# Patient Record
Sex: Male | Born: 1960 | Race: White | Hispanic: No | Marital: Married | Smoking: Never smoker
Health system: Southern US, Community
[De-identification: ages and names within clinical notes are randomized; demographics above are authoritative.]

## PROBLEM LIST (undated history)

## (undated) DIAGNOSIS — E785 Hyperlipidemia, unspecified: Secondary | ICD-10-CM

## (undated) DIAGNOSIS — R0683 Snoring: Secondary | ICD-10-CM

## (undated) DIAGNOSIS — I10 Essential (primary) hypertension: Secondary | ICD-10-CM

## (undated) DIAGNOSIS — R5383 Other fatigue: Secondary | ICD-10-CM

## (undated) HISTORY — DX: Other fatigue: R53.83

## (undated) HISTORY — DX: Essential (primary) hypertension: I10

## (undated) HISTORY — PX: SHOULDER SURGERY: SHX246

## (undated) HISTORY — DX: Hyperlipidemia, unspecified: E78.5

## (undated) HISTORY — DX: Snoring: R06.83

---

## 2004-09-02 ENCOUNTER — Ambulatory Visit (HOSPITAL_COMMUNITY): Admission: RE | Admit: 2004-09-02 | Discharge: 2004-09-02 | Payer: Self-pay | Admitting: Family Medicine

## 2005-09-22 ENCOUNTER — Ambulatory Visit (HOSPITAL_COMMUNITY): Admission: RE | Admit: 2005-09-22 | Discharge: 2005-09-22 | Payer: Self-pay | Admitting: Family Medicine

## 2005-10-21 ENCOUNTER — Encounter (HOSPITAL_COMMUNITY): Admission: RE | Admit: 2005-10-21 | Discharge: 2005-11-20 | Payer: Self-pay | Admitting: Orthopaedic Surgery

## 2006-12-24 ENCOUNTER — Ambulatory Visit (HOSPITAL_COMMUNITY): Admission: RE | Admit: 2006-12-24 | Discharge: 2006-12-24 | Payer: Self-pay | Admitting: General Surgery

## 2007-03-25 ENCOUNTER — Ambulatory Visit (HOSPITAL_COMMUNITY): Admission: RE | Admit: 2007-03-25 | Discharge: 2007-03-25 | Payer: Self-pay | Admitting: Family Medicine

## 2009-07-16 ENCOUNTER — Ambulatory Visit (HOSPITAL_COMMUNITY): Admission: RE | Admit: 2009-07-16 | Discharge: 2009-07-16 | Payer: Self-pay | Admitting: General Surgery

## 2010-12-09 NOTE — Op Note (Signed)
NAME:  Robert Curtis, Robert Curtis NO.:  [id number]   MEDICAL RECORD NO.:  [id number]          PATIENT TYPE:  AMB   LOCATION:  DAY                           FACILITY:  [facility]   PHYSICIAN:  Dalia Heading, M.D.  DATE OF BIRTH:  1961/06/09   DATE OF PROCEDURE:  12/24/2006  DATE OF DISCHARGE:                               OPERATIVE REPORT   PREOPERATIVE DIAGNOSIS:  Anal fistula.   POSTOPERATIVE DIAGNOSIS:  Anal fistula, submuscular.   PROCEDURE:  Anal fistulotomy.   ANESTHESIA:  General.   INDICATIONS:  The patient is a 50 year old white male with a recurrent  anal fistula.  The patient now comes to the operating room for an anal  fistulotomy.  Risks and benefits of the procedure including bleeding,  infection, pain, recurrence, and incontinence were fully explained to  the patient, who gave informed consent.   PROCEDURE NOTE:  The patient was placed in the lithotomy position after  general anesthesia was administered.  The perineum was prepped and  draped in the usual sterile technique with Betadine.  Surgical site  confirmation was performed.   The patient was noted to have a draining sinus at the 2 o'clock  position.  A probe was used and this did connect with the dentate line  directly in a straight line, but it was partially submuscular in nature.  A fistulotomy was performed partially, leaving the external sphincter  muscle intact.  The pocket was curetted.  Any bleeding was controlled  using Bovie electrocautery.  0.5% Sensorcaine was instilled in the  surrounding region.  NuGauze was then packed into the wound.   All tape and needle counts were correct at the end of the procedure.  The patient was awakened and transferred to PACU in stable condition.  Complications none.   SPECIMEN:  None.   BLOOD LOSS:  Minimal.      Dalia Heading, M.D.  Electronically Signed     MAJ/MEDQ  D:  12/24/2006  T:  12/24/2006  Job:  [id number]   cc:   Corrie Mckusick,  M.D.  Fax: [phone]

## 2010-12-09 NOTE — H&P (Signed)
NAME:  Robert Curtis, Robert Curtis NO.:  [id number]   MEDICAL RECORD NO.:  [id number]          PATIENT TYPE:  AMB   LOCATION:                                FACILITY:  [facility]   PHYSICIAN:  Dalia Heading, M.D.  DATE OF BIRTH:  1961-07-09   DATE OF ADMISSION:  DATE OF DISCHARGE:  LH                              HISTORY & PHYSICAL   CHIEF COMPLAINT:  Anal fistula.   HISTORY OF PRESENT ILLNESS:  The patient is a 50 year old white male who  had a persistent perianal fistula.  He has failed two courses of  antibiotics now presents for an anal fistulotomy.   PAST MEDICAL HISTORY:  Includes hypertension, reflux disease.   PAST SURGICAL HISTORY:  Unremarkable.   CURRENT MEDICATIONS:  Toprol, loratadine, Darvocet.   ALLERGIES:  No known drug allergies.   REVIEW OF SYSTEMS:  Noncontributory.   PHYSICAL EXAMINATION:  GENERAL:  The patient is a well-developed, well-  nourished, white male in no acute distress.  LUNGS:  Clear to auscultation with equal breath sounds bilaterally.  HEART EXAMINATION:  Reveals regular rate and rhythm without S3, S4,  murmurs.  RECTAL EXAMINATION:  Reveals a small draining area with induration and a  sinus present along the left side of the anus.   IMPRESSION:  Anal fistula.   PLAN:  The patient is scheduled for an anal fistulotomy on 12/24/2006.  The risks and benefits of the procedure including bleeding, infection,  and recurrence of the fistula were fully explained to the patient, gave  informed consent.      Dalia Heading, M.D.  Electronically Signed     MAJ/MEDQ  D:  12/16/2006  T:  12/16/2006  Job:  [id number]   cc:   Corrie Mckusick, M.D.  Fax: [phone]

## 2011-08-27 ENCOUNTER — Ambulatory Visit (HOSPITAL_COMMUNITY)
Admission: RE | Admit: 2011-08-27 | Discharge: 2011-08-27 | Disposition: A | Discharge status: Home / Self Care | Payer: 59 | Source: Ambulatory Visit | Attending: Internal Medicine | Admitting: Internal Medicine

## 2011-08-27 ENCOUNTER — Other Ambulatory Visit (HOSPITAL_COMMUNITY): Payer: Self-pay | Admitting: Internal Medicine

## 2011-08-27 DIAGNOSIS — R05 Cough: Secondary | ICD-10-CM

## 2011-08-27 DIAGNOSIS — J069 Acute upper respiratory infection, unspecified: Secondary | ICD-10-CM

## 2011-08-27 DIAGNOSIS — R059 Cough, unspecified: Secondary | ICD-10-CM | POA: Insufficient documentation

## 2011-08-27 DIAGNOSIS — R0602 Shortness of breath: Secondary | ICD-10-CM | POA: Insufficient documentation

## 2012-11-25 ENCOUNTER — Ambulatory Visit (HOSPITAL_COMMUNITY)
Admission: RE | Admit: 2012-11-25 | Discharge: 2012-11-25 | Disposition: A | Discharge status: Home / Self Care | Payer: 59 | Source: Ambulatory Visit | Attending: Family Medicine | Admitting: Family Medicine

## 2012-11-25 ENCOUNTER — Other Ambulatory Visit (HOSPITAL_COMMUNITY): Payer: Self-pay | Admitting: Family Medicine

## 2012-11-25 DIAGNOSIS — E785 Hyperlipidemia, unspecified: Secondary | ICD-10-CM

## 2012-11-25 DIAGNOSIS — R05 Cough: Secondary | ICD-10-CM

## 2012-11-25 DIAGNOSIS — R059 Cough, unspecified: Secondary | ICD-10-CM

## 2012-11-25 DIAGNOSIS — I10 Essential (primary) hypertension: Secondary | ICD-10-CM

## 2013-01-19 ENCOUNTER — Ambulatory Visit (INDEPENDENT_AMBULATORY_CARE_PROVIDER_SITE_OTHER): Payer: 59 | Admitting: Otolaryngology

## 2013-01-19 DIAGNOSIS — R490 Dysphonia: Secondary | ICD-10-CM

## 2013-01-19 DIAGNOSIS — R05 Cough: Secondary | ICD-10-CM

## 2013-01-19 DIAGNOSIS — J310 Chronic rhinitis: Secondary | ICD-10-CM

## 2013-01-19 DIAGNOSIS — K219 Gastro-esophageal reflux disease without esophagitis: Secondary | ICD-10-CM

## 2013-02-23 ENCOUNTER — Ambulatory Visit (INDEPENDENT_AMBULATORY_CARE_PROVIDER_SITE_OTHER): Payer: 59 | Admitting: Otolaryngology

## 2013-02-23 DIAGNOSIS — K219 Gastro-esophageal reflux disease without esophagitis: Secondary | ICD-10-CM

## 2013-02-23 DIAGNOSIS — R070 Pain in throat: Secondary | ICD-10-CM

## 2014-02-09 ENCOUNTER — Ambulatory Visit (INDEPENDENT_AMBULATORY_CARE_PROVIDER_SITE_OTHER): Payer: BC Managed Care – PPO | Admitting: Urology

## 2014-02-09 DIAGNOSIS — E669 Obesity, unspecified: Secondary | ICD-10-CM

## 2014-02-09 DIAGNOSIS — E291 Testicular hypofunction: Secondary | ICD-10-CM

## 2015-08-15 ENCOUNTER — Encounter: Payer: Self-pay | Admitting: Neurology

## 2015-08-15 ENCOUNTER — Ambulatory Visit (INDEPENDENT_AMBULATORY_CARE_PROVIDER_SITE_OTHER): Payer: BLUE CROSS/BLUE SHIELD | Admitting: Neurology

## 2015-08-15 VITALS — BP 142/102 | HR 72 | Resp 20 | Ht 66.0 in | Wt 294.0 lb

## 2015-08-15 DIAGNOSIS — R519 Headache, unspecified: Secondary | ICD-10-CM

## 2015-08-15 DIAGNOSIS — R0683 Snoring: Secondary | ICD-10-CM

## 2015-08-15 DIAGNOSIS — R51 Headache: Secondary | ICD-10-CM | POA: Diagnosis not present

## 2015-08-15 NOTE — Patient Instructions (Signed)

## 2015-08-15 NOTE — Progress Notes (Signed)
[facility]   Provider:  Melvyn Novas, M D  Referring Provider: Karleen Hampshire, MD Primary Care Physician:  Colette Ribas, MD  Chief Complaint  Patient presents with  . New Patient (Initial Visit)    snoring per pt wife, rm 10, alone    HPI:  Robert Curtis is a 55 y.o. male , seen here as a referral  from Dr. Phillips Odor,  Chief complaint according to patient : " my wife stated that I snore, and I feel tired "  Robert Curtis is seen here today upon referral of his primary care physician. He reported that his wife was concerned about his snoring and that he himself has felt more fatigued and daytime sleepy than he used to be. Over the last couple of years he has become more increasingly fatigued and sleepy in daytime. This gentleman go up as a some of the [place] man and reported that he had been through 13 schools in 12 years of grade school. He now lives for over 3 decades in [place] on his paternal family farm. He works for a Human resources officer. His wife is using a CPAP machine and has been familiar with the treatment of obstructive sleep apnea. She was the one who reported to him that she snores.  Sleep habits are as follows: The patient describes the marital bedroom is cool, quiet and dark. He sleeps usually on one pillow, his bedtime is around 10 PM and he falls asleep promptly. He stays asleep for at least 2-3 hours before his sleep is for the first time interrupted usually for bathroom break. He may then discontinue to sleep quickly until he has a second bathroom break around 4:30.AM . He wakes up at 5:30 usually before his alarm goes off. Sometimes he wakes up with headaches and often with a dry mouth. He takes an hour or longer to feel woken up so he is neither restored normal refreshed in the morning.   He drinks coffee in the morning but he does not have a regular breakfast. He drinks coffee on the way to the office. 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  Him days the patient struggles to stay awake but is not usually the case. He also stated that his idea of a nap is lasting for hours. And that is very rare that he has the opportunity to do so. He works regularly indoors but has no access to natural daylight.   Sleep medical history and family sleep history:  His father is a loud snorer but does not have observed apneas. Father and mother struggled with alcohol. Father had cluster headaches, Paternal cousin on CPAP, obese 450 pounds. No history of sleep walking.   Social history: one cup of coffee, no caffeine use in the afternoon. At home he uses decaffeinated iced tea and decaffeinated sodas. He has never been a tobacco user, he drinks infrequently alcohol. Probably less than 7 drinks a month.  Review of Systems: Out of a complete 14 system review, the patient complains of only the following symptoms, and all other reviewed systems are negative. Snoring.   Epworth score 4 , Fatigue severity score 29  , depression score 2   Social History   Social History  . Marital Status: Married    Spouse Name: N/A  . Number of Children: N/A  . Years of Education: N/A   Occupational History  . Not on  file.   Social History Main Topics  . Smoking status: Never Smoker   . Smokeless tobacco: Not on file  . Alcohol Use: 0.0 oz/week    0 Standard drinks or equivalent per week     Comment: 1-3 times per month  . Drug Use: No  . Sexual Activity: Not on file   Other Topics Concern  . Not on file   Social History Narrative   1-2 cups of coffee daily.    Family History  Problem Relation Age of Onset  . Hypertension Father   . Prostate cancer Father   . Hyperlipidemia Brother   . Hypertension Brother   . Lung cancer Paternal Grandmother   . Prostate cancer Paternal Grandfather   . Hypertension Mother   . Atrial fibrillation Mother     Past Medical History  Diagnosis Date  . Fatigue     . Hyperlipidemia   . Hypertension   . Snoring     Past Surgical History  Procedure Laterality Date  . Shoulder surgery      Current Outpatient Prescriptions  Medication Sig Dispense Refill  . aspirin 81 MG tablet Take 81 mg by mouth daily.    . benzonatate (TESSALON) 100 MG capsule Take by mouth 3 (three) times daily as needed for cough.    . cetirizine (ZYRTEC) 10 MG tablet Take 10 mg by mouth daily.    . furosemide (LASIX) 40 MG tablet Take 40 mg by mouth daily as needed.    . hydrochlorothiazide (HYDRODIURIL) 25 MG tablet Take 25 mg by mouth daily.    Marland Kitchen ipratropium (ATROVENT) 0.03 % nasal spray Place 2 sprays into both nostrils every 12 (twelve) hours.    . Magnesium 250 MG TABS Take 250 mg by mouth.    Marland Kitchen MEGARED OMEGA-3 KRILL OIL PO Take 600 mg by mouth.    . metoprolol succinate (TOPROL-XL) 100 MG 24 hr tablet Take 100 mg by mouth daily. Take with or immediately following a meal.    . omeprazole (PRILOSEC) 40 MG capsule Take 40 mg by mouth daily.    . Potassium Chloride ER 20 MEQ TBCR Take 20 mEq by mouth.    . ranitidine (ZANTAC) 150 MG tablet Take 150 mg by mouth 2 (two) times daily.    . Vitamin D, Ergocalciferol, (DRISDOL) 50000 units CAPS capsule Take 50,000 Units by mouth every 7 (seven) days.     No current facility-administered medications for this visit.    Allergies as of 08/15/2015 - Review Complete 08/15/2015  Allergen Reaction Noted  . Tetracyclines & related Nausea Only 08/15/2015    Vitals: BP 142/102 mmHg  Pulse 72  Resp 20  Ht  (1.676 m)  Wt 294 lb (133.358 kg)  BMI 47.48 kg/m2 Last Weight:  Wt Readings from Last 1 Encounters:  08/15/15 294 lb (133.358 kg)   ZOX:WRUE mass index is 47.48 kg/(m^2).     Last Height:   Ht Readings from Last 1 Encounters:  08/15/15  (1.676 m)    Physical exam:  General: The patient is awake, alert and appears not in acute distress. The patient is well groomed. Head: Normocephalic, atraumatic. Neck is  supple. Mallampati 1, red   neck circumference: 18 inches , Nasal airflow , TMJ is  evident . Retrognathia is seen.  Cardiovascular:  Regular rate and rhythm, without  murmurs or carotid bruit, and without distended neck veins. Respiratory: Lungs are clear to auscultation. Skin:  Without evidence of edema, or rash  Trunk: BMI is elevated - 48 . The patient's posture is erect   Neurologic exam : The patient is awake and alert, oriented to place and time.   Memory subjective  described as intact.  Attention span & concentration ability appears normal.  Speech is fluent,  without  dysarthria, dysphonia or aphasia.  Mood and affect are appropriate.  Cranial nerves: Pupils are equal and briskly reactive to light. Funduscopic exam deferred. Extraocular movements in vertical and horizontal planes intact and without nystagmus. Visual fields by finger perimetry are intact. Hearing to finger rub intact. Facial sensation intact to fine touch. Facial motor strength is symmetric and tongue and uvula move midline. Shoulder shrug is symmetrical.   Motor exam: Normal tone, muscle bulk and symmetric strength in all extremities.  Sensory:  Fine touch, pinprick and vibration were tested in all extremities. Proprioception tested in the upper extremities was normal.  Coordination: Rapid alternating movements and finger-to-nose maneuver without evidence of ataxia, dysmetria or tremor.  Gait and station: Patient walks without assistive device and is able unassisted to climb up to the exam table. Strength within normal limits.  Stance is stable and normal.  Deep tendon reflexes: in the  upper and lower extremities are symmetric and intact. Babinski maneuver response is  downgoing.  The patient was advised of the nature of the diagnosed sleep disorder , the treatment options and risks for general a health and wellness arising from not treating the condition.  I spent more than 40 minutes of face to face time with  the patient. Greater than 50% of time was spent in counseling and coordination of care. We have discussed the diagnosis and differential and I answered the patient's questions.     Assessment:  After physical and neurologic examination, review of laboratory studies,  Personal review of imaging studies, reports of other /same  Imaging studies ,  Results of polysomnography/ neurophysiology testing and pre-existing records as far as provided in visit., my assessment is   1) Mr. Hoffer has multiple risk factors for obstructive sleep apnea he has a larger neck circumference and most at 18 inches, his elevated body mass index at approximately 48, and he is barrel chested. However his upper airway is not exceedingly narrow. He has been witnessed to snore but his wife has not reported having witnessed apneas. He himself has not woken himself up gasping for air or feeling air hungry. He wakes up feeling unrefreshed, unrestored having a dry mouth and at least occasionally morning headaches. He has never experienced cluster headaches at night waking him. The baseline assessment Will include an attended sleep study with SPIT protocol and Co2 monitoring.   Plan:  Treatment plan and additional workup :  split, Co2, O2 monitoring.   RV after sleep study.    Porfirio Mylar Eniya Cannady MD  08/15/2015   CC: Karleen Hampshire, Md [address] [place], [place] [address]

## 2015-09-10 ENCOUNTER — Telehealth: Payer: Self-pay | Admitting: Neurology

## 2015-09-10 DIAGNOSIS — R0683 Snoring: Secondary | ICD-10-CM

## 2015-09-10 NOTE — Telephone Encounter (Signed)
[employer] denied split in lab sleep study. [employer] states it doesn't meet medical criteria.  I would be glad to see if I can get a Home sleep test approved.

## 2015-09-10 NOTE — Telephone Encounter (Signed)
HST is it. CD

## 2015-09-24 ENCOUNTER — Telehealth: Payer: Self-pay | Admitting: Neurology

## 2015-09-24 DIAGNOSIS — G4730 Sleep apnea, unspecified: Secondary | ICD-10-CM

## 2015-09-24 DIAGNOSIS — R0683 Snoring: Secondary | ICD-10-CM

## 2015-09-24 NOTE — Telephone Encounter (Signed)
Patient is snoring loudly , operating a vehicle and sleepy in day time.

## 2015-09-24 NOTE — Telephone Encounter (Signed)
[employer] denied the Home Sleep Test.  [employer] had also denied the Split Sleep Test.  Snoring doesn't meet criteria.

## 2015-10-29 ENCOUNTER — Encounter (INDEPENDENT_AMBULATORY_CARE_PROVIDER_SITE_OTHER): Payer: BLUE CROSS/BLUE SHIELD | Admitting: Neurology

## 2015-10-29 DIAGNOSIS — R0683 Snoring: Secondary | ICD-10-CM

## 2015-10-29 DIAGNOSIS — G4733 Obstructive sleep apnea (adult) (pediatric): Secondary | ICD-10-CM

## 2015-11-07 ENCOUNTER — Telehealth: Payer: Self-pay

## 2015-11-07 DIAGNOSIS — G4733 Obstructive sleep apnea (adult) (pediatric): Secondary | ICD-10-CM

## 2015-11-07 NOTE — Telephone Encounter (Signed)
Spoke to pt and advised him that his HST revealed severe osa and hypoxia and treatment is advised. Dr. Vickey Hugerohmeier advises that pap therapy is indicated and a pap titration study is needed to optimize therapy. I advised weight loss and sleeping on pt's side. I advised pt to avoid sedative hypnotics which may worsen sleep apnea, alcohol, and tobacco. I advised pt to lose weight, diet, and exercise if not contraindicated by his other physicians. I advised pt to not drive or operate hazardous machinery while sleepy. Pt verbalized understanding. Pt is agreeable to coming in for a cpap titration. Pt is concerned that his insurance will not cover it. I advised pt that Dr. Oliva Bustardohmeier's next option is usually an auto-pap trial. Pt verbalized understanding.

## 2015-11-07 NOTE — Telephone Encounter (Signed)
Called pt to discuss sleep study results. No answer, left a message asking him to call me back. 

## 2015-11-13 ENCOUNTER — Telehealth: Payer: Self-pay | Admitting: Neurology

## 2015-11-13 DIAGNOSIS — G4733 Obstructive sleep apnea (adult) (pediatric): Secondary | ICD-10-CM

## 2015-11-13 NOTE — Telephone Encounter (Signed)
CPAP titration denied. HST revealed severe OSA/hypoxia. AHI was 50.6/hr with lowest oxygen saturation of 64%.  Ok to order auto cpap 5-15 cm H2O?

## 2015-11-13 NOTE — Telephone Encounter (Signed)
CPAP denied doesn't meet criteria, APAP was approved ref# [id number] from 11/12/15-02/10/16 with the DME company of [employer].

## 2015-11-13 NOTE — Addendum Note (Signed)
Addended by: Geronimo RunningINKINS, Jameshia Hayashida A on: 11/13/2015 02:04 PM   Modules accepted: Orders

## 2015-11-13 NOTE — Telephone Encounter (Signed)
Spoke to pt. I advised him that his cpap titration study was denied. Dr. Vickey Hugerohmeier recommends an auto cpap. Pt is agreeable to trying an auto cpap in his home at night. I advised pt that an order for cpap will be sent to a DME, and the DME will call him to set up the cpap. Pt verbalized understanding. Will send to [facility]. Pt was advised to not drive or operate hazardous machinery when sleepy. Pt verbalized understanding.  A follow up appt was made with pt for 6/21 at 3:30. Pt verbalized understanding.

## 2015-11-13 NOTE — Telephone Encounter (Signed)
Please order auto titration.

## 2016-01-15 ENCOUNTER — Ambulatory Visit (INDEPENDENT_AMBULATORY_CARE_PROVIDER_SITE_OTHER): Payer: BLUE CROSS/BLUE SHIELD | Admitting: Neurology

## 2016-01-15 ENCOUNTER — Encounter: Payer: Self-pay | Admitting: Neurology

## 2016-01-15 VITALS — BP 156/80 | HR 88 | Resp 20 | Ht 67.0 in | Wt 289.0 lb

## 2016-01-15 DIAGNOSIS — G4734 Idiopathic sleep related nonobstructive alveolar hypoventilation: Secondary | ICD-10-CM | POA: Diagnosis not present

## 2016-01-15 DIAGNOSIS — G4733 Obstructive sleep apnea (adult) (pediatric): Secondary | ICD-10-CM

## 2016-01-15 DIAGNOSIS — Z9989 Dependence on other enabling machines and devices: Secondary | ICD-10-CM

## 2016-01-15 NOTE — Progress Notes (Signed)
SLEEP MEDICINE CLINIC   Provider:  Melvyn Novas, M D  Referring Provider: Assunta Found, MD Primary Care Physician:  Colette Ribas, MD  Chief Complaint  Patient presents with  . Follow-up    cpap    HPI:  Robert Curtis is a 55 y.o. male , seen here as a referral  from Dr. Phillips Odor,  Chief complaint according to patient : " my wife stated that I snore, and I feel tired "  Robert Curtis is seen here today upon referral of his primary care physician. He reported that his wife was concerned about his snoring and that he himself has felt more fatigued and daytime sleepy than he used to be. Over the last couple of years he has become more increasingly fatigued and sleepy in daytime. This gentleman go up as a some of the [place] man and reported that he had been through 13 schools in 12 years of grade school. He now lives for over 3 decades in [place] on his paternal family farm. He works for a Human resources officer. His wife is using a CPAP machine and has been familiar with the treatment of obstructive sleep apnea. She was the one who reported to him that she snores.  Sleep habits are as follows: The patient describes the marital bedroom is cool, quiet and dark. He sleeps usually on one pillow, his bedtime is around 10 PM and he falls asleep promptly. He stays asleep for at least 2-3 hours before his sleep is for the first time interrupted usually for bathroom break. He may then discontinue to sleep quickly until he has a second bathroom break around 4:30.AM . He wakes up at 5:30 usually before his alarm goes off. Sometimes he wakes up with headaches and often with a dry mouth. He takes an hour or longer to feel woken up so he is neither restored normal refreshed in the morning.   He drinks coffee in the morning but he does not have a regular breakfast. He drinks coffee on the way to the office. 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    Him days the patient struggles to stay awake but is not usually the case. He also stated that his idea of a nap is lasting for hours. And that is very rare that he has the opportunity to do so. He works regularly indoors but has no access to natural daylight.    Interval history from 01/15/2016. Robert Curtis is here today in a revisit following his home sleep test dated 4 of April 2017. The home sleep test indicated a significant degree of obstructive apnea with an AHI of 50.6 and an RDI of 53.3. The patient did not have bradycardia or tachyarrhythmia but is very prolonged time of low oxygen. The lowest oxygen levels at night was 64% saturation with a total desaturation time of  236 minutes. DME : He was referred to [facility].  In the meantime he has been placed on CPAP auto settings between 5 and 15 cm water with 3 cm EPR. I'm able to review a 30 day download today he has a 90% compliance is used to machine every day but 27 days over 4 hours consecutively. The average user time is 5 hours and 47 minutes. The residual AHI is now 2.1 he does have high air leaks however. He likes a FFM , has a mustache, but no other facial hair.Marland Kitchen  Sleep medical history and family sleep history:  His father is a loud snorer but does not have observed apneas. Father and mother struggled with alcohol. Father had cluster headaches, Paternal cousin on CPAP, obese 450 pounds. No history of sleep walking.  Social history: one cup of coffee, no caffeine use in the afternoon. At home he uses decaffeinated iced tea and decaffeinated sodas. He has never been a tobacco user, he drinks infrequently alcohol. Probably less than 7 drinks a month.  Review of Systems: Out of a complete 14 system review, the patient complains of only the following symptoms, and all other reviewed systems are negative. Snoring now controlled on CPAP.  Obesity, large abdominal width.   Epworth score 5, Fatigue severity score 29  , depression  score 0/15   Social History   Social History  . Marital Status: Married    Spouse Name: N/A  . Number of Children: N/A  . Years of Education: N/A   Occupational History  . Not on file.   Social History Main Topics  . Smoking status: Never Smoker   . Smokeless tobacco: Not on file  . Alcohol Use: 0.0 oz/week    0 Standard drinks or equivalent per week     Comment: 1-3 times per month  . Drug Use: No  . Sexual Activity: Not on file   Other Topics Concern  . Not on file   Social History Narrative   1-2 cups of coffee daily.    Family History  Problem Relation Age of Onset  . Hypertension Father   . Prostate cancer Father   . Hyperlipidemia Brother   . Hypertension Brother   . Lung cancer Paternal Grandmother   . Prostate cancer Paternal Grandfather   . Hypertension Mother   . Atrial fibrillation Mother     Past Medical History  Diagnosis Date  . Fatigue   . Hyperlipidemia   . Hypertension   . Snoring     Past Surgical History  Procedure Laterality Date  . Shoulder surgery      Current Outpatient Prescriptions  Medication Sig Dispense Refill  . aspirin 81 MG tablet Take 81 mg by mouth daily.    . benzonatate (TESSALON) 100 MG capsule Take by mouth 3 (three) times daily as needed for cough.    . cetirizine (ZYRTEC) 10 MG tablet Take 10 mg by mouth daily.    . furosemide (LASIX) 40 MG tablet Take 40 mg by mouth daily as needed.    . hydrochlorothiazide (HYDRODIURIL) 25 MG tablet Take 25 mg by mouth daily.    Marland Kitchen. ipratropium (ATROVENT) 0.03 % nasal spray Place 2 sprays into both nostrils every 12 (twelve) hours.    . Magnesium 250 MG TABS Take 250 mg by mouth.    Marland Kitchen. MEGARED OMEGA-3 KRILL OIL PO Take 600 mg by mouth.    . metoprolol succinate (TOPROL-XL) 100 MG 24 hr tablet Take 100 mg by mouth daily. Take with or immediately following a meal.    . omeprazole (PRILOSEC) 40 MG capsule Take 40 mg by mouth daily.    . Potassium Chloride ER 20 MEQ TBCR Take 20 mEq  by mouth.    . ranitidine (ZANTAC) 150 MG tablet Take 150 mg by mouth 2 (two) times daily.    . Vitamin D, Ergocalciferol, (DRISDOL) 50000 units CAPS capsule Take 50,000 Units by mouth every 7 (seven) days.     No current facility-administered medications for this visit.    Allergies as of  01/15/2016 - Review Complete 01/15/2016  Allergen Reaction Noted  . Tetracyclines & related Nausea Only 08/15/2015    Vitals: BP 156/80 mmHg  Pulse 88  Resp 20  Ht 5\' 7"  (1.702 m)  Wt 289 lb (131.09 kg)  BMI 45.25 kg/m2 Last Weight:  Wt Readings from Last 1 Encounters:  01/15/16 289 lb (131.09 kg)   VHQ:IONG mass index is 45.25 kg/(m^2).     Last Height:   Ht Readings from Last 1 Encounters:  01/15/16 5\' 7"  (1.702 m)    Physical exam:  General: The patient is awake, alert and appears not in acute distress. The patient is well groomed. Head: Normocephalic, atraumatic. Neck is supple. Mallampati 1, red   neck circumference: 18 inches , Nasal airflow , TMJ is  evident . Retrognathia is seen.  Cardiovascular:  Regular rate and rhythm, without  murmurs or carotid bruit, and without distended neck veins. Respiratory: Lungs are clear to auscultation. Skin:  Without evidence of edema, or rash Trunk: BMI is elevated - 48 . The patient's posture is erect   Neurologic exam : The patient is awake and alert, oriented to place and time.   Memory subjective  described as intact.  Attention span & concentration ability appears normal.  Speech is fluent,  without  dysarthria, dysphonia or aphasia.  Mood and affect are appropriate.  Cranial nerves: Pupils are equal and briskly reactive to light. Funduscopic exam deferred. Extraocular movements in vertical and horizontal planes intact and without nystagmus. Visual fields by finger perimetry are intact. Hearing to finger rub intact. Facial sensation intact to fine touch. Facial motor strength is symmetric and tongue and uvula move midline. Shoulder shrug  is symmetrical.    The patient was advised of the nature of the diagnosed sleep disorder , the treatment options and risks for general a health and wellness arising from not treating the condition.  I spent more than 20 minutes of face to face time with the patient. Greater than 50% of time was spent in counseling and coordination of care. We have discussed the diagnosis and differential and I answered the patient's questions.     Assessment:  After physical and neurologic examination, review of laboratory studies,  Personal review of imaging studies, reports of other /same  Imaging studies ,  Results of polysomnography/ neurophysiology testing and pre-existing records as far as provided in visit., my assessment is   1) Mr. Herter has multiple risk factors for obstructive sleep apnea ; a larger neck circumference at 18 inches, his elevated body mass index at approximately 48, and he is barrel chested. However his upper airway is not exceedingly narrow. His insurance only allowed a home sleep test and not an attended sleep test as I had originally ordered,  however the home sleep test identified a severe apnea hypotony index at 50.6 and an RDI 53.3. Based on this and a BMI of 48 the patient was placed on CPAP direct outer titration and his AHI has been reduced to 2.1 with a 90 fifths pressure percentile of 11.5 cm water. Needs no adjustments. The patient is 90% compliant and he can follow-up once a year from now on.  Should there be an increase or decrease in his body mass index or any injury or surgical alteration of his upper airway I would like to see him earlier.   Porfirio Mylar Amilyah Nack MD  01/15/2016   CC: Assunta Found, Md [address] [place], [place] [address]

## 2016-04-08 ENCOUNTER — Ambulatory Visit (HOSPITAL_COMMUNITY)
Admission: RE | Admit: 2016-04-08 | Discharge: 2016-04-08 | Disposition: A | Discharge status: Home / Self Care | Payer: BLUE CROSS/BLUE SHIELD | Source: Ambulatory Visit | Attending: Family Medicine | Admitting: Family Medicine

## 2016-04-08 ENCOUNTER — Other Ambulatory Visit (HOSPITAL_COMMUNITY): Payer: Self-pay | Admitting: Family Medicine

## 2016-04-08 DIAGNOSIS — Z1389 Encounter for screening for other disorder: Secondary | ICD-10-CM | POA: Diagnosis present

## 2016-04-08 DIAGNOSIS — R2241 Localized swelling, mass and lump, right lower limb: Secondary | ICD-10-CM | POA: Insufficient documentation

## 2016-04-08 DIAGNOSIS — Z6841 Body Mass Index (BMI) 40.0 and over, adult: Secondary | ICD-10-CM | POA: Diagnosis not present

## 2016-04-09 ENCOUNTER — Other Ambulatory Visit (HOSPITAL_COMMUNITY): Payer: Self-pay | Admitting: Family Medicine

## 2016-04-10 ENCOUNTER — Other Ambulatory Visit (HOSPITAL_COMMUNITY): Payer: Self-pay | Admitting: Family Medicine

## 2016-04-10 DIAGNOSIS — R2241 Localized swelling, mass and lump, right lower limb: Secondary | ICD-10-CM

## 2016-06-09 ENCOUNTER — Ambulatory Visit (HOSPITAL_COMMUNITY): Admission: RE | Admit: 2016-06-09 | Payer: BLUE CROSS/BLUE SHIELD | Source: Ambulatory Visit

## 2016-06-10 ENCOUNTER — Other Ambulatory Visit (HOSPITAL_COMMUNITY): Payer: Self-pay | Admitting: Family Medicine

## 2016-06-10 ENCOUNTER — Ambulatory Visit (HOSPITAL_COMMUNITY)
Admission: RE | Admit: 2016-06-10 | Discharge: 2016-06-10 | Disposition: A | Discharge status: Home / Self Care | Payer: BLUE CROSS/BLUE SHIELD | Source: Ambulatory Visit | Attending: Family Medicine | Admitting: Family Medicine

## 2016-06-10 ENCOUNTER — Ambulatory Visit (HOSPITAL_COMMUNITY): Payer: BLUE CROSS/BLUE SHIELD

## 2016-06-10 DIAGNOSIS — R2241 Localized swelling, mass and lump, right lower limb: Secondary | ICD-10-CM

## 2016-06-10 LAB — CREATININE, SERUM: CREATININE: 1.12 mg/dL (ref 0.61–1.24)

## 2016-06-10 MED ORDER — GADOBENATE DIMEGLUMINE 529 MG/ML IV SOLN
20.0000 mL | Freq: Once | INTRAVENOUS | Status: AC | PRN
  Administered 2016-06-10: 20 mL via INTRAVENOUS

## 2016-11-11 ENCOUNTER — Encounter: Payer: Self-pay | Admitting: Neurology

## 2017-01-14 ENCOUNTER — Ambulatory Visit: Payer: BLUE CROSS/BLUE SHIELD | Admitting: Neurology

## 2017-11-23 ENCOUNTER — Ambulatory Visit (HOSPITAL_COMMUNITY)
Admission: RE | Admit: 2017-11-23 | Discharge: 2017-11-23 | Disposition: A | Discharge status: Home / Self Care | Payer: Managed Care, Other (non HMO) | Source: Ambulatory Visit | Attending: Family Medicine | Admitting: Family Medicine

## 2017-11-23 ENCOUNTER — Other Ambulatory Visit (HOSPITAL_COMMUNITY): Payer: Self-pay | Admitting: Family Medicine

## 2017-11-23 DIAGNOSIS — R0602 Shortness of breath: Secondary | ICD-10-CM

## 2017-11-23 DIAGNOSIS — J069 Acute upper respiratory infection, unspecified: Secondary | ICD-10-CM | POA: Diagnosis not present

## 2017-11-23 DIAGNOSIS — Z6841 Body Mass Index (BMI) 40.0 and over, adult: Secondary | ICD-10-CM | POA: Diagnosis present

## 2017-12-28 ENCOUNTER — Ambulatory Visit: Payer: Managed Care, Other (non HMO) | Admitting: Cardiology

## 2019-07-17 ENCOUNTER — Telehealth: Payer: Self-pay | Admitting: *Deleted

## 2019-07-17 NOTE — Telephone Encounter (Signed)
Patients wife was in the office today and states patients CPAP mask is torn and he needs a new one.  He is scheduled for a follow up appt in February but he needs this taken care of ASAP.  Please call to discuss what his options are

## 2019-07-17 NOTE — Telephone Encounter (Signed)
Called the patient and discussed with him that it has been over 3 yrs since we last saw him in office visit. The patient was originally set up with his machine through [facility] but has since then purchased supplies online. He attempted to order through a supplier and they required a script to be sent. Advised the patient that since we have not seen him in 3 yrs we would need a referral from PCP and we can get him in for a apt. Advised the patient that they had scheduled him for a follow up 2/4 but informed that since it has been over 3 yrs it would need to come across as a new referral before we can actually schedule the patient. Patient was confused because all he needs is a mask, I  Informed him that we have to follow up with him yearly for insurance purposes and that allows [place] to order supplies and document compliance. Pt will call PCP. Patient also states with COVID he is not wanting to come in office. Informed we have mychart video visit capability and we can offer that as a option. The pt download is able to print from [facility]. I will cancel the feb apt.

## 2019-08-31 ENCOUNTER — Ambulatory Visit: Payer: BLUE CROSS/BLUE SHIELD | Admitting: Neurology

## 2021-07-23 ENCOUNTER — Ambulatory Visit (HOSPITAL_COMMUNITY)
Admission: RE | Admit: 2021-07-23 | Discharge: 2021-07-23 | Disposition: A | Discharge status: Home / Self Care | Payer: Managed Care, Other (non HMO) | Source: Ambulatory Visit | Attending: Family Medicine | Admitting: Family Medicine

## 2021-07-23 ENCOUNTER — Other Ambulatory Visit: Payer: Self-pay

## 2021-07-23 ENCOUNTER — Other Ambulatory Visit (HOSPITAL_COMMUNITY): Payer: Self-pay | Admitting: Family Medicine

## 2021-07-23 DIAGNOSIS — R053 Chronic cough: Secondary | ICD-10-CM

## 2023-05-14 LAB — LAB REPORT - SCANNED
EGFR (Non-African Amer.): 67
PSA, Total: 0.7
TSH: 3.16 (ref 0.41–5.90)

## 2023-06-06 IMAGING — DX DG CHEST 2V
2 series · 2 of 2 positions shown · non-contrast
Comparison: 11/23/2017

CLINICAL DATA: Cough for 2 months.

EXAM:
CHEST - 2 VIEW

[chest pa]
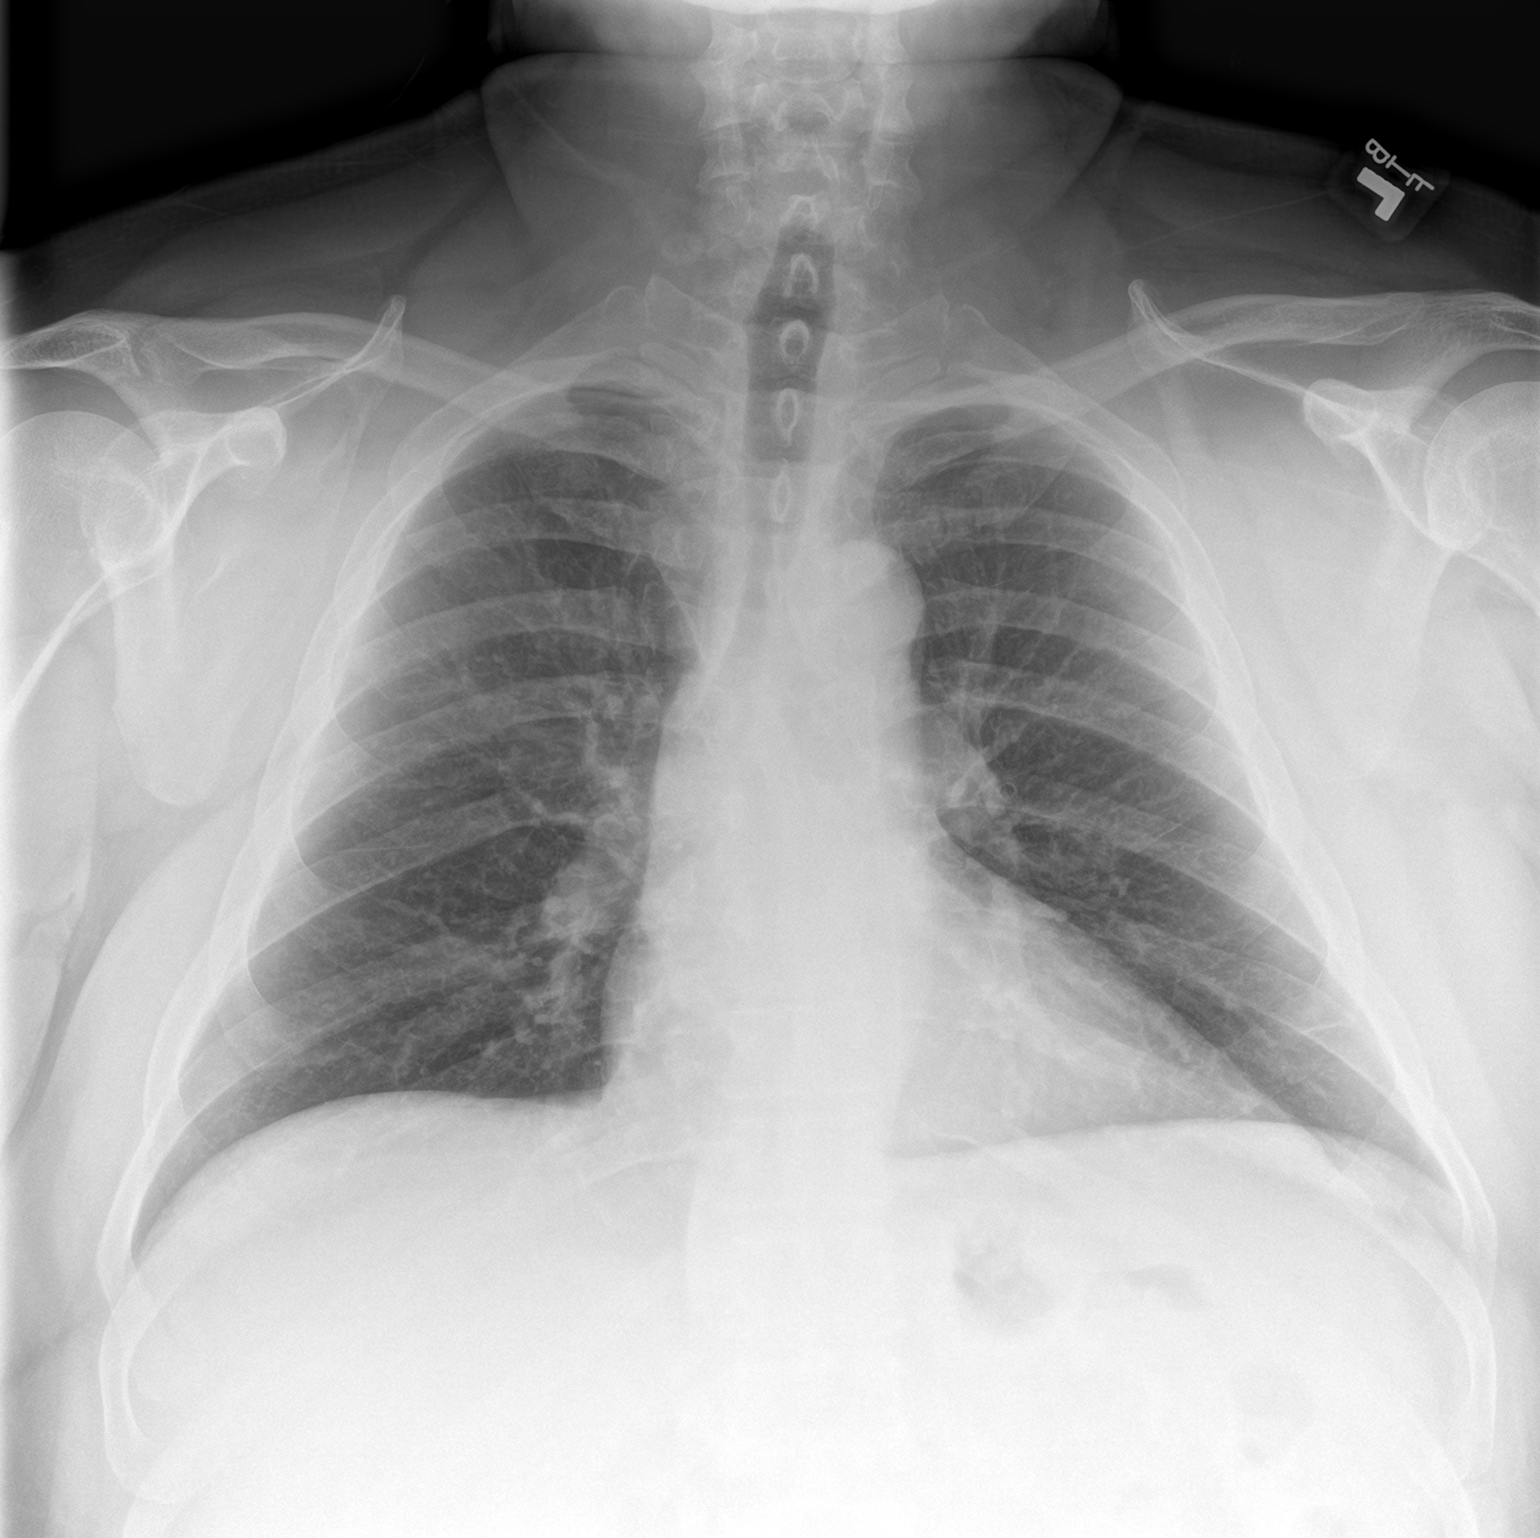

[chest lat]
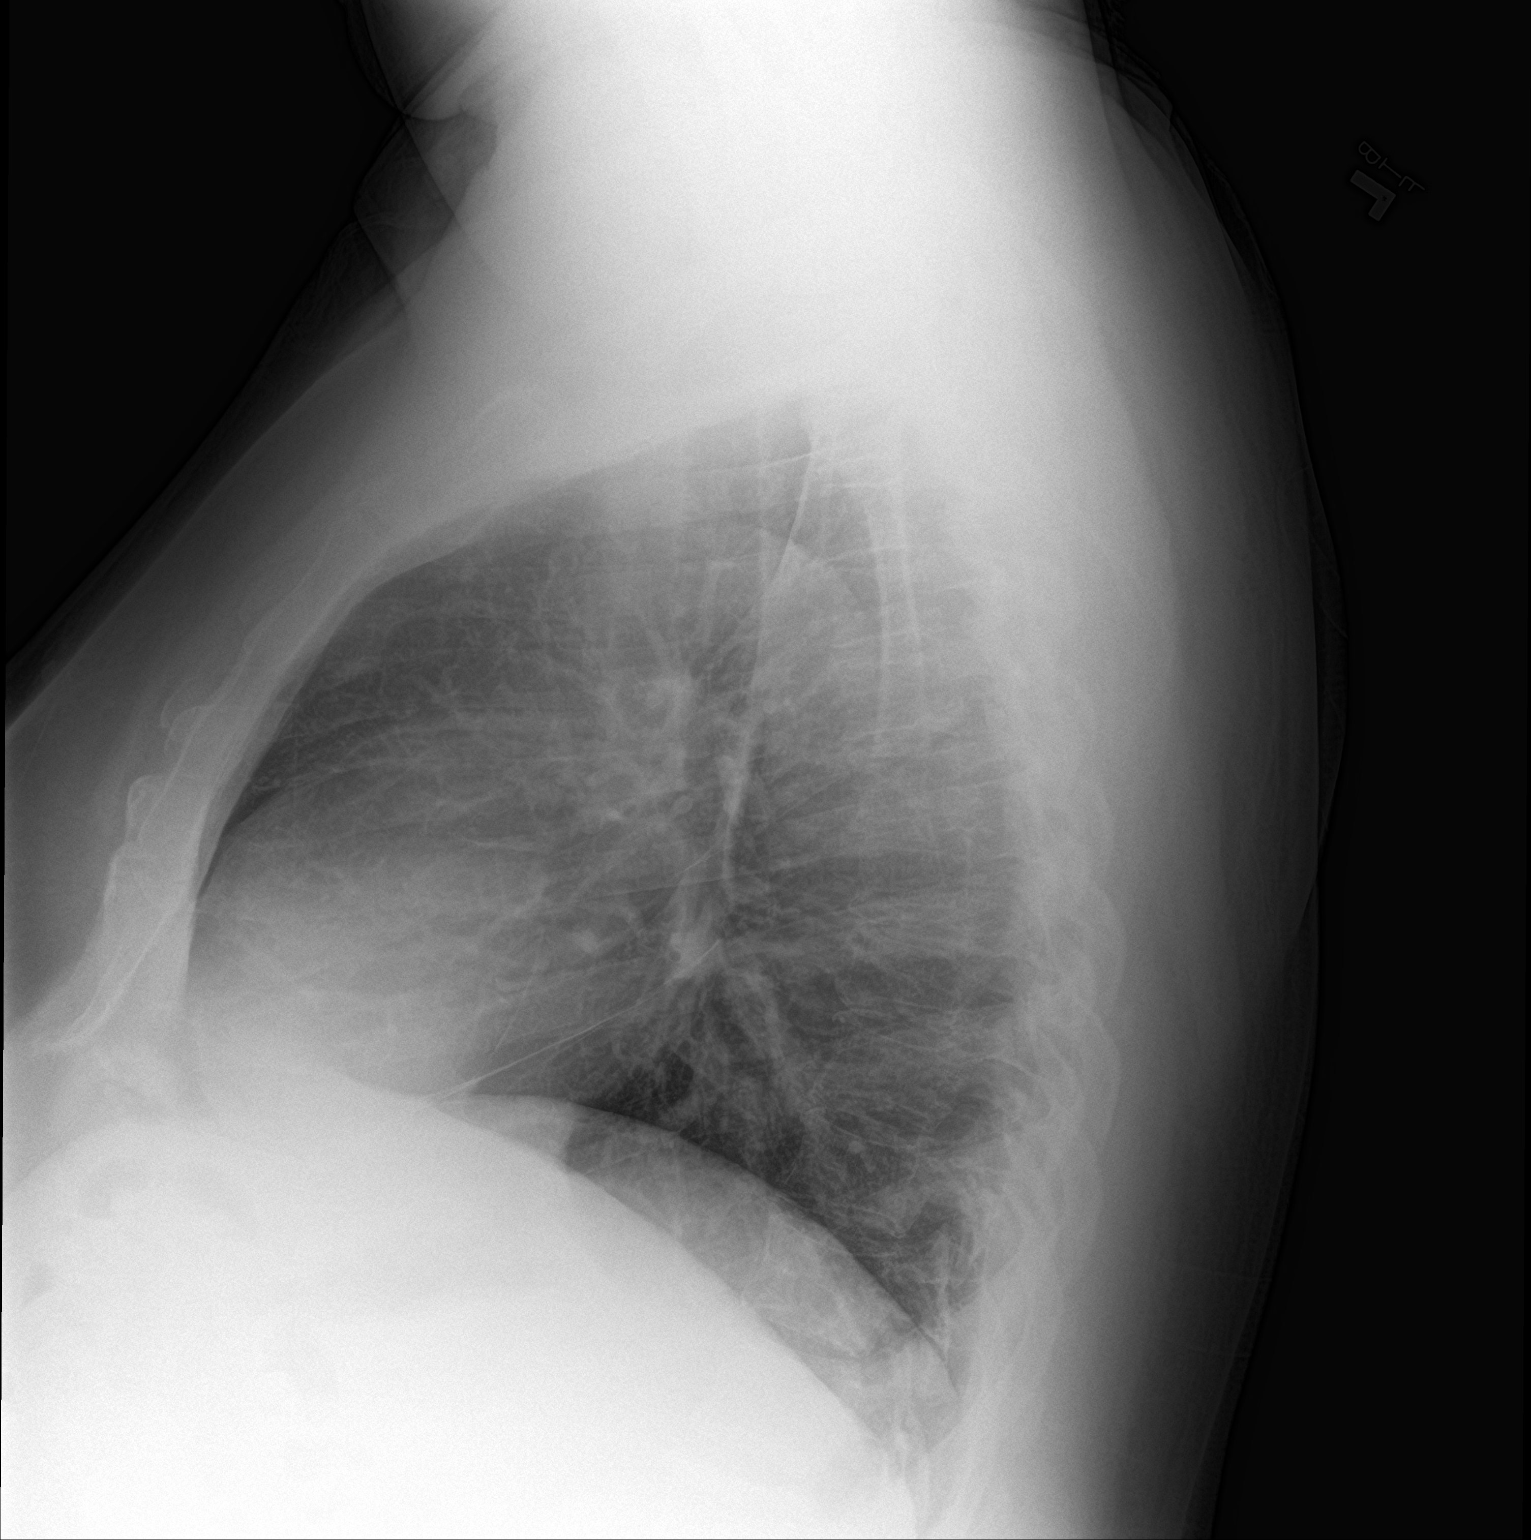

[2 of 2 positions shown; findings below may reference images not displayed]

FINDINGS: The heart size and mediastinal contours are within normal limits.
Both lungs are clear. The visualized skeletal structures are
unremarkable.
IMPRESSION: No active cardiopulmonary disease.

## 2023-11-10 NOTE — H&P (View-Only) (Signed)
 Cardiology Office Note:  .   Date:  11/11/2023  ID:  Robert Curtis, DOB 1960/09/01, MRN [id number] PCP: Minus Amel, MD  [facility] Providers Cardiologist:  Oneil Bigness, MD    History of Present Illness: .    Chief Complaint  Patient presents with   Chest Pain    Robert Curtis is a 63 y.o. male with history of obesity, HTN, HLD who presents for the evaluation of chest pain at the request of Minus Amel, MD.   History of Present Illness   Robert Curtis is a 63 year old male with hypertension, hyperlipidemia, and obesity who presents with chest pain. He was referred by Dr. Glady Laming for evaluation of chest pain.  He has been experiencing chest pain for over a week, initially triggered by walking up a steep incline. The pain is described as a tightness and dull aching sensation, akin to being in a 'vice grip'. It typically lasts a couple of minutes and resolves with rest. A similar episode occurred at work, which did not resolve quickly, prompting him to seek medical attention. He has continued to have daily chest discomfort with exertion.   He has a history of premature ventricular contractions (PVCs) diagnosed 30 years ago, managed by avoiding caffeine and Sudafed. He has not had issues with PVCs for a long time until the recent episodes of chest pain.  The chest discomfort occurs with exertion, such as walking or physical activity, and resolves with rest. He did not experience chest pain while sitting at home but did experience discomfort upon arriving at the clinic after walking around the building.  He takes medication for hypertension but does not take cholesterol medication. He has been taking a full dose aspirin instead of a baby aspirin. He also takes Celebrex for fibromyalgia and arthritis.  His family history is significant for heart disease, with his father's mother and his mother having heart issues. His mother had atrial fibrillation. He does not  smoke and rarely consumes alcohol.  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          Problem List Obesity 2. HLD -T chol 223, HDL 39, LDL 152, TG 173 3. HTN 4. Fibromyalgia     ROS: All other ROS reviewed and negative. Pertinent positives noted in the HPI.     Studies Reviewed: Aaron Aas   EKG Interpretation Date/Time:  Thursday November 11 2023 14:21:44 EDT Ventricular Rate:  77 PR Interval:  152 QRS Duration:  88 QT Interval:  370 QTC Calculation: 418 R Axis:   -23  Text Interpretation: Normal sinus rhythm Minimal voltage criteria for LVH, may be normal variant ( R in aVL ) Confirmed by Jackquelyn Mass [phone]) on 11/11/2023 2:32:01 PM   Physical Exam:   VS:  BP 134/86   Pulse 77   Ht 5\' 7"  (1.702 m)   Wt 291 lb 9.6 oz (132.3 kg)   SpO2 91%   BMI 45.67 kg/m    Wt Readings from Last 3 Encounters:  11/11/23 291 lb 9.6 oz (132.3 kg)  01/15/16 289 lb (131.1 kg)  08/15/15 294 lb (133.4 kg)    GEN: Well nourished, well developed in no acute distress NECK: No JVD; No carotid bruits CARDIAC: RRR, no murmurs, rubs, gallops RESPIRATORY:  Clear to auscultation without rales, wheezing or rhonchi  ABDOMEN: Soft, non-tender, non-distended EXTREMITIES:  No edema; No deformity  ASSESSMENT AND PLAN: .   Assessment  and Plan    Unstable angina Chest discomfort with minimal activity, resolving with rest, worsening symptoms. EKG shows nonspecific ST depression. Potential coronary artery disease. Risk factors: hyperlipidemia, hypertension, family history. - Schedule heart catheterization at [facility] next week. - Perform pre-procedure blood work today, including BMP and CBC. - Start aspirin 81 mg daily. - Prescribe metoprolol tartrate 25 mg BID. - Prescribe sublingual nitroglycerin as needed for chest pain. Emergency precautions given.  - Schedule echocardiogram before follow-up in six weeks. - Defer starting cholesterol medication until after  catheterization results per patient request.   Hypertension Well-controlled with medication.  Hyperlipidemia Recent LDL of 152 mg/dL. Decision to start cholesterol medication deferred until after heart catheterization results.  Obesity BMI of 45, a risk factor for cardiovascular disease.  Follow-up Necessary to evaluate heart catheterization results and adjust treatment plan. - Schedule follow-up appointment in six weeks after heart catheterization.          Informed Consent   Shared Decision Making/Informed Consent The risks [stroke (1 in 1000), death (1 in 1000), kidney failure [usually temporary] (1 in 500), bleeding (1 in 200), allergic reaction [possibly serious] (1 in 200)], benefits (diagnostic support and management of coronary artery disease) and alternatives of a cardiac catheterization were discussed in detail with Robert Curtis and he is willing to proceed.      Follow-up: Return in about 6 weeks (around 12/23/2023).   Signed, Gigi Kyle. Rolm Clos, MD, [facility]  [facility]  [address], [address] [place], [place] [address] [phone]  5:34 PM

## 2023-11-10 NOTE — Progress Notes (Unsigned)
 Cardiology Office Note:  .   Date:  11/11/2023  ID:  Robert Curtis, DOB 1960/09/01, MRN [id number] PCP: Minus Amel, MD  [facility] Providers Cardiologist:  Oneil Bigness, MD    History of Present Illness: .    Chief Complaint  Patient presents with   Chest Pain    Robert Curtis is a 63 y.o. male with history of obesity, HTN, HLD who presents for the evaluation of chest pain at the request of Minus Amel, MD.   History of Present Illness   Robert Curtis is a 63 year old male with hypertension, hyperlipidemia, and obesity who presents with chest pain. He was referred by Dr. Glady Laming for evaluation of chest pain.  He has been experiencing chest pain for over a week, initially triggered by walking up a steep incline. The pain is described as a tightness and dull aching sensation, akin to being in a 'vice grip'. It typically lasts a couple of minutes and resolves with rest. A similar episode occurred at work, which did not resolve quickly, prompting him to seek medical attention. He has continued to have daily chest discomfort with exertion.   He has a history of premature ventricular contractions (PVCs) diagnosed 30 years ago, managed by avoiding caffeine and Sudafed. He has not had issues with PVCs for a long time until the recent episodes of chest pain.  The chest discomfort occurs with exertion, such as walking or physical activity, and resolves with rest. He did not experience chest pain while sitting at home but did experience discomfort upon arriving at the clinic after walking around the building.  He takes medication for hypertension but does not take cholesterol medication. He has been taking a full dose aspirin instead of a baby aspirin. He also takes Celebrex for fibromyalgia and arthritis.  His family history is significant for heart disease, with his father's mother and his mother having heart issues. His mother had atrial fibrillation. He does not  smoke and rarely consumes alcohol.  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          Problem List Obesity 2. HLD -T chol 223, HDL 39, LDL 152, TG 173 3. HTN 4. Fibromyalgia     ROS: All other ROS reviewed and negative. Pertinent positives noted in the HPI.     Studies Reviewed: Aaron Aas   EKG Interpretation Date/Time:  Thursday November 11 2023 14:21:44 EDT Ventricular Rate:  77 PR Interval:  152 QRS Duration:  88 QT Interval:  370 QTC Calculation: 418 R Axis:   -23  Text Interpretation: Normal sinus rhythm Minimal voltage criteria for LVH, may be normal variant ( R in aVL ) Confirmed by Jackquelyn Mass [phone]) on 11/11/2023 2:32:01 PM   Physical Exam:   VS:  BP 134/86   Pulse 77   Ht 5\' 7"  (1.702 m)   Wt 291 lb 9.6 oz (132.3 kg)   SpO2 91%   BMI 45.67 kg/m    Wt Readings from Last 3 Encounters:  11/11/23 291 lb 9.6 oz (132.3 kg)  01/15/16 289 lb (131.1 kg)  08/15/15 294 lb (133.4 kg)    GEN: Well nourished, well developed in no acute distress NECK: No JVD; No carotid bruits CARDIAC: RRR, no murmurs, rubs, gallops RESPIRATORY:  Clear to auscultation without rales, wheezing or rhonchi  ABDOMEN: Soft, non-tender, non-distended EXTREMITIES:  No edema; No deformity  ASSESSMENT AND PLAN: .   Assessment  and Plan    Unstable angina Chest discomfort with minimal activity, resolving with rest, worsening symptoms. EKG shows nonspecific ST depression. Potential coronary artery disease. Risk factors: hyperlipidemia, hypertension, family history. - Schedule heart catheterization at [facility] next week. - Perform pre-procedure blood work today, including BMP and CBC. - Start aspirin 81 mg daily. - Prescribe metoprolol tartrate 25 mg BID. - Prescribe sublingual nitroglycerin as needed for chest pain. Emergency precautions given.  - Schedule echocardiogram before follow-up in six weeks. - Defer starting cholesterol medication until after  catheterization results per patient request.   Hypertension Well-controlled with medication.  Hyperlipidemia Recent LDL of 152 mg/dL. Decision to start cholesterol medication deferred until after heart catheterization results.  Obesity BMI of 45, a risk factor for cardiovascular disease.  Follow-up Necessary to evaluate heart catheterization results and adjust treatment plan. - Schedule follow-up appointment in six weeks after heart catheterization.          Informed Consent   Shared Decision Making/Informed Consent The risks [stroke (1 in 1000), death (1 in 1000), kidney failure [usually temporary] (1 in 500), bleeding (1 in 200), allergic reaction [possibly serious] (1 in 200)], benefits (diagnostic support and management of coronary artery disease) and alternatives of a cardiac catheterization were discussed in detail with Mr. Rufener and he is willing to proceed.      Follow-up: Return in about 6 weeks (around 12/23/2023).   Signed, Gigi Kyle. Rolm Clos, MD, [facility]  [facility]  [address], [address] [place], [place] [address] [phone]  5:34 PM

## 2023-11-11 ENCOUNTER — Encounter: Payer: Self-pay | Admitting: Cardiovascular Disease

## 2023-11-11 ENCOUNTER — Ambulatory Visit: Attending: Cardiovascular Disease | Admitting: Cardiovascular Disease

## 2023-11-11 ENCOUNTER — Other Ambulatory Visit: Payer: Self-pay | Admitting: *Deleted

## 2023-11-11 VITALS — BP 134/86 | HR 77 | Ht 67.0 in | Wt 291.6 lb

## 2023-11-11 DIAGNOSIS — E782 Mixed hyperlipidemia: Secondary | ICD-10-CM

## 2023-11-11 DIAGNOSIS — I200 Unstable angina: Secondary | ICD-10-CM | POA: Diagnosis not present

## 2023-11-11 DIAGNOSIS — R072 Precordial pain: Secondary | ICD-10-CM | POA: Diagnosis not present

## 2023-11-11 DIAGNOSIS — I150 Renovascular hypertension: Secondary | ICD-10-CM

## 2023-11-11 MED ORDER — METOPROLOL TARTRATE 25 MG PO TABS
25.0000 mg | ORAL_TABLET | Freq: Two times a day (BID) | ORAL | 3 refills | Status: DC
Start: 2023-11-11 — End: 2024-03-10

## 2023-11-11 MED ORDER — ASPIRIN 81 MG PO TBEC: 81.0000 mg | DELAYED_RELEASE_TABLET | Freq: Every day | ORAL | Status: AC

## 2023-11-11 MED ORDER — NITROGLYCERIN 0.4 MG SL SUBL
0.4000 mg | SUBLINGUAL_TABLET | SUBLINGUAL | 6 refills | Status: AC | PRN
Start: 2023-11-11 — End: 2024-03-10

## 2023-11-11 NOTE — Patient Instructions (Signed)
 Medication Instructions:   REDUCE ASPIRIN TO 81 MG ONCE DAILY  START METOPROLOL TARTRATE 25 MG ONE TABLET TWICE DAILY  TAKE NTG ONE TABLET UNDER THE TONGUE FOR CHEST PAIN MAY TAKE UP TO 3- 5 MINUTES APART  *If you need a refill on your cardiac medications before your next appointment, please call your pharmacy*  Testing/Procedures:  Your physician has requested that you have an echocardiogram. Echocardiography is a painless test that uses sound waves to create images of your heart. It provides your doctor with information about the size and shape of your heart and how well your heart's chambers and valves are working. This procedure takes approximately one hour. There are no restrictions for this procedure. Please do NOT wear cologne, perfume, aftershave, or lotions (deodorant is allowed). Please arrive 15 minutes prior to your appointment time.  Please note: We ask at that you not bring children with you during ultrasound (echo/ vascular) testing. Due to room size and safety concerns, children are not allowed in the ultrasound rooms during exams. Our front office staff cannot provide observation of children in our lobby area while testing is being conducted. An adult accompanying a patient to their appointment will only be allowed in the ultrasound room at the discretion of the ultrasound technician under special circumstances. We apologize for any inconvenience. [facility]       Cardiac Catheterization   You are scheduled for a Cardiac Catheterization on Tuesday, April 29 with Dr. Alverda Skeans.  1. Please arrive at the [facility] (Main Entrance A) at [facility]: [address] [place], [place] [address] at 7:00 AM (This time is 2 hour(s) before your procedure to ensure your preparation).   Free valet parking service is available. You will check in at ADMITTING. The support person will be asked to wait in the waiting room.  It is OK to have someone drop you off and  come back when you are ready to be discharged.        Special note: Every effort is made to have your procedure done on time. Please understand that emergencies sometimes delay scheduled procedures.  2. Diet: Do not eat solid foods after midnight.  You may have clear liquids until 5 AM the day of the procedure.  3. Labs: You will need to have blood drawn on TODAY.  4. Medication instructions in preparation for your procedure:  On the morning of your procedure, take Aspirin 81 mg and all morning medications. You may use sips of water.  5. Plan to go home the same day, you will only stay overnight if medically necessary. 6. You MUST have a responsible adult to drive you home. 7. An adult MUST be with you the first 24 hours after you arrive home. 8. Bring a current list of your medications, and the last time and date medication taken. 9. Bring ID and current insurance cards. 10.Please wear clothes that are easy to get on and off and wear slip-on shoes.  Thank you for allowing [place] to care for you!   -- [facility]   Follow-Up: At [facility], you and your health needs are our priority.  As part of our continuing mission to provide you with exceptional heart care, our providers are all part of one team.  This team includes your primary Cardiologist (physician) and Advanced Practice Providers or APPs (Physician Assistants and Nurse Practitioners) who all work together to provide you with the care you need,  when you need it.  Your next appointment:   6 week(s)  Provider:   DR Rolm Clos         1st Floor: - Lobby - Registration  - Pharmacy  - Lab - Cafe  2nd Floor: - PV Lab - Diagnostic Testing (echo, CT, nuclear med)  3rd Floor: - Vacant  4th Floor: - TCTS (cardiothoracic surgery) - AFib Clinic - Structural Heart Clinic - Vascular Surgery  - Vascular Ultrasound  5th Floor: - HeartCare Cardiology (general and EP) - Clinical  Pharmacy for coumadin, hypertension, lipid, weight-loss medications, and med management appointments    Valet parking services will be available as well.

## 2023-11-12 ENCOUNTER — Encounter: Payer: Self-pay | Admitting: Cardiovascular Disease

## 2023-11-12 LAB — CBC
Hematocrit: 43 % (ref 37.5–51.0)
Hemoglobin: 14.9 g/dL (ref 13.0–17.7)
MCH: 28.2 pg (ref 26.6–33.0)
MCHC: 34.7 g/dL (ref 31.5–35.7)
MCV: 81 fL (ref 79–97)
Platelets: 287 10*3/uL (ref 150–450)
RBC: 5.29 x10E6/uL (ref 4.14–5.80)
RDW: 12.7 % (ref 11.6–15.4)
WBC: 6.6 10*3/uL (ref 3.4–10.8)

## 2023-11-12 LAB — BASIC METABOLIC PANEL WITH GFR
BUN/Creatinine Ratio: 15 (ref 10–24)
BUN: 17 mg/dL (ref 8–27)
CO2: 29 mmol/L (ref 20–29)
Calcium: 10.1 mg/dL (ref 8.6–10.2)
Chloride: 101 mmol/L (ref 96–106)
Creatinine, Ser: 1.17 mg/dL (ref 0.76–1.27)
Glucose: 124 mg/dL — ABNORMAL HIGH (ref 70–99)
Potassium: 3.9 mmol/L (ref 3.5–5.2)
Sodium: 144 mmol/L (ref 134–144)
eGFR: 71 mL/min/1.73

## 2023-11-22 ENCOUNTER — Telehealth: Payer: Self-pay | Admitting: *Deleted

## 2023-11-22 NOTE — Telephone Encounter (Signed)
 Cardiac Catheterization scheduled at [facility] for: Tuesday November 23, 2023 9 AM Arrival time [facility] Main Entrance A at: 7 AM  Nothing to eat after midnight prior to procedure, clear liquids until 5 AM day of procedure.  Medication instructions: -Hold:  Olmesartan-hydrochlorothiazide-AM of procedure  -Other usual morning medications can be taken with sips of water including aspirin  81 mg.  Plan to go home the same day, you will only stay overnight if medically necessary.  You must have responsible adult to drive you home.  Someone must be with you the first 24 hours after you arrive home.  Reviewed procedure instructions with patient.

## 2023-11-23 ENCOUNTER — Encounter (HOSPITAL_COMMUNITY)
Admission: RE | Disposition: A | Discharge status: Home / Self Care | Payer: Self-pay | Source: Home / Self Care | Attending: Internal Medicine

## 2023-11-23 ENCOUNTER — Ambulatory Visit (HOSPITAL_COMMUNITY)
Admission: RE | Admit: 2023-11-23 | Discharge: 2023-11-23 | Disposition: A | Discharge status: Home / Self Care | Attending: Internal Medicine | Admitting: Internal Medicine

## 2023-11-23 ENCOUNTER — Other Ambulatory Visit: Payer: Self-pay

## 2023-11-23 ENCOUNTER — Other Ambulatory Visit (HOSPITAL_COMMUNITY): Payer: Self-pay

## 2023-11-23 DIAGNOSIS — Z6841 Body Mass Index (BMI) 40.0 and over, adult: Secondary | ICD-10-CM | POA: Diagnosis not present

## 2023-11-23 DIAGNOSIS — M797 Fibromyalgia: Secondary | ICD-10-CM | POA: Diagnosis not present

## 2023-11-23 DIAGNOSIS — E785 Hyperlipidemia, unspecified: Secondary | ICD-10-CM | POA: Insufficient documentation

## 2023-11-23 DIAGNOSIS — E66813 Obesity, class 3: Secondary | ICD-10-CM | POA: Diagnosis not present

## 2023-11-23 DIAGNOSIS — Z955 Presence of coronary angioplasty implant and graft: Secondary | ICD-10-CM

## 2023-11-23 DIAGNOSIS — Z79899 Other long term (current) drug therapy: Secondary | ICD-10-CM | POA: Diagnosis not present

## 2023-11-23 DIAGNOSIS — I10 Essential (primary) hypertension: Secondary | ICD-10-CM | POA: Insufficient documentation

## 2023-11-23 DIAGNOSIS — Z8249 Family history of ischemic heart disease and other diseases of the circulatory system: Secondary | ICD-10-CM | POA: Diagnosis not present

## 2023-11-23 DIAGNOSIS — I25110 Atherosclerotic heart disease of native coronary artery with unstable angina pectoris: Secondary | ICD-10-CM | POA: Diagnosis not present

## 2023-11-23 DIAGNOSIS — I25119 Atherosclerotic heart disease of native coronary artery with unspecified angina pectoris: Secondary | ICD-10-CM | POA: Diagnosis not present

## 2023-11-23 DIAGNOSIS — Z7982 Long term (current) use of aspirin: Secondary | ICD-10-CM | POA: Diagnosis not present

## 2023-11-23 DIAGNOSIS — I200 Unstable angina: Secondary | ICD-10-CM

## 2023-11-23 DIAGNOSIS — R079 Chest pain, unspecified: Secondary | ICD-10-CM

## 2023-11-23 DIAGNOSIS — I2510 Atherosclerotic heart disease of native coronary artery without angina pectoris: Secondary | ICD-10-CM | POA: Insufficient documentation

## 2023-11-23 DIAGNOSIS — I209 Angina pectoris, unspecified: Secondary | ICD-10-CM | POA: Diagnosis present

## 2023-11-23 HISTORY — PX: CORONARY IMAGING/OCT: CATH118326

## 2023-11-23 HISTORY — PX: LEFT HEART CATH AND CORONARY ANGIOGRAPHY: CATH118249

## 2023-11-23 HISTORY — PX: CORONARY STENT INTERVENTION: CATH118234

## 2023-11-23 LAB — POCT ACTIVATED CLOTTING TIME
Activated Clotting Time: 245 s
Activated Clotting Time: 291 s
Activated Clotting Time: 181 s

## 2023-11-23 SURGERY — LEFT HEART CATH AND CORONARY ANGIOGRAPHY
Anesthesia: LOCAL

## 2023-11-23 MED ORDER — HEPARIN (PORCINE) IN NACL 1000-0.9 UT/500ML-% IV SOLN
INTRAVENOUS | Status: DC | PRN
  Administered 2023-11-23: 1000 mL

## 2023-11-23 MED ORDER — SODIUM CHLORIDE 0.9 % WEIGHT BASED INFUSION: 3.0000 mL/kg/h | INTRAVENOUS | Status: AC

## 2023-11-23 MED ORDER — SODIUM CHLORIDE 0.9% FLUSH: 3.0000 mL | INTRAVENOUS | Status: DC | PRN

## 2023-11-23 MED ORDER — ASPIRIN 81 MG PO TBEC: 81.0000 mg | DELAYED_RELEASE_TABLET | Freq: Every day | ORAL | 0 refills | Status: DC

## 2023-11-23 MED ORDER — ROSUVASTATIN CALCIUM 40 MG PO TABS
40.0000 mg | ORAL_TABLET | Freq: Every day | ORAL | 11 refills | Status: DC
  Filled 2023-11-23: qty 30, 30d supply, fill #0

## 2023-11-23 MED ORDER — CLOPIDOGREL BISULFATE 300 MG PO TABS
ORAL_TABLET | ORAL | Status: AC
  Filled 2023-11-23: qty 2

## 2023-11-23 MED ORDER — CLOPIDOGREL BISULFATE 75 MG PO TABS
75.0000 mg | ORAL_TABLET | Freq: Every day | ORAL | 3 refills | Status: DC
  Filled 2023-11-23: qty 30, 30d supply, fill #0

## 2023-11-23 MED ORDER — SODIUM CHLORIDE 0.9 % WEIGHT BASED INFUSION: 1.0000 mL/kg/h | INTRAVENOUS | Status: DC

## 2023-11-23 MED ORDER — HEPARIN SODIUM (PORCINE) 1000 UNIT/ML IJ SOLN
INTRAMUSCULAR | Status: AC
  Filled 2023-11-23: qty 10

## 2023-11-23 MED ORDER — FENTANYL CITRATE (PF) 100 MCG/2ML IJ SOLN
INTRAMUSCULAR | Status: DC | PRN
  Administered 2023-11-23 (×2): 25 ug via INTRAVENOUS

## 2023-11-23 MED ORDER — LIDOCAINE HCL (PF) 1 % IJ SOLN
INTRAMUSCULAR | Status: DC | PRN
  Administered 2023-11-23: 2 mL

## 2023-11-23 MED ORDER — SODIUM CHLORIDE 0.9 % IV SOLN: 250.0000 mL | INTRAVENOUS | Status: DC | PRN

## 2023-11-23 MED ORDER — ACETAMINOPHEN 325 MG PO TABS: 650.0000 mg | ORAL_TABLET | ORAL | Status: DC | PRN

## 2023-11-23 MED ORDER — FENTANYL CITRATE (PF) 100 MCG/2ML IJ SOLN
INTRAMUSCULAR | Status: AC
  Filled 2023-11-23: qty 2

## 2023-11-23 MED ORDER — CLOPIDOGREL BISULFATE 300 MG PO TABS
ORAL_TABLET | ORAL | Status: DC | PRN
  Administered 2023-11-23: 600 mg via ORAL

## 2023-11-23 MED ORDER — ASPIRIN 81 MG PO CHEW: 81.0000 mg | CHEWABLE_TABLET | Freq: Every day | ORAL | Status: DC

## 2023-11-23 MED ORDER — LIDOCAINE HCL (PF) 1 % IJ SOLN
INTRAMUSCULAR | Status: AC
  Filled 2023-11-23: qty 30

## 2023-11-23 MED ORDER — MIDAZOLAM HCL 2 MG/2ML IJ SOLN
INTRAMUSCULAR | Status: AC
  Filled 2023-11-23: qty 2

## 2023-11-23 MED ORDER — CLOPIDOGREL BISULFATE 75 MG PO TABS: 75.0000 mg | ORAL_TABLET | Freq: Every day | ORAL | Status: DC

## 2023-11-23 MED ORDER — VERAPAMIL HCL 2.5 MG/ML IV SOLN
INTRAVENOUS | Status: AC
  Filled 2023-11-23: qty 2

## 2023-11-23 MED ORDER — SODIUM CHLORIDE 0.9 % IV SOLN: INTRAVENOUS | Status: AC

## 2023-11-23 MED ORDER — PANTOPRAZOLE SODIUM 40 MG PO TBEC
40.0000 mg | DELAYED_RELEASE_TABLET | Freq: Every day | ORAL | 3 refills | Status: DC
  Filled 2023-11-23: qty 30, 30d supply, fill #0

## 2023-11-23 MED ORDER — ASPIRIN 81 MG PO CHEW: 81.0000 mg | CHEWABLE_TABLET | ORAL | Status: DC

## 2023-11-23 MED ORDER — VERAPAMIL HCL 2.5 MG/ML IV SOLN
INTRAVENOUS | Status: DC | PRN
  Administered 2023-11-23: 5 mL via INTRA_ARTERIAL

## 2023-11-23 MED ORDER — SODIUM CHLORIDE 0.9 % WEIGHT BASED INFUSION: 3.0000 mL/kg/h | INTRAVENOUS | Status: DC

## 2023-11-23 MED ORDER — ONDANSETRON HCL 4 MG/2ML IJ SOLN: 4.0000 mg | Freq: Four times a day (QID) | INTRAMUSCULAR | Status: DC | PRN

## 2023-11-23 MED ORDER — MIDAZOLAM HCL 2 MG/2ML IJ SOLN
INTRAMUSCULAR | Status: DC | PRN
  Administered 2023-11-23 (×2): 1 mg via INTRAVENOUS

## 2023-11-23 MED ORDER — LABETALOL HCL 5 MG/ML IV SOLN: 10.0000 mg | INTRAVENOUS | Status: DC | PRN

## 2023-11-23 MED ORDER — HYDRALAZINE HCL 20 MG/ML IJ SOLN: 10.0000 mg | INTRAMUSCULAR | Status: DC | PRN

## 2023-11-23 MED ORDER — IOHEXOL 350 MG/ML SOLN
INTRAVENOUS | Status: DC | PRN
  Administered 2023-11-23: 210 mL

## 2023-11-23 MED ORDER — SODIUM CHLORIDE 0.9% FLUSH: 3.0000 mL | Freq: Two times a day (BID) | INTRAVENOUS | Status: DC

## 2023-11-23 MED ORDER — CLOPIDOGREL BISULFATE 75 MG PO TABS: 75.0000 mg | ORAL_TABLET | Freq: Every day | ORAL | 0 refills | Status: DC

## 2023-11-23 MED ORDER — HEPARIN SODIUM (PORCINE) 1000 UNIT/ML IJ SOLN
INTRAMUSCULAR | Status: DC | PRN
  Administered 2023-11-23: 1000 [IU] via INTRAVENOUS
  Administered 2023-11-23: 5000 [IU] via INTRAVENOUS
  Administered 2023-11-23: 9000 [IU] via INTRAVENOUS
  Administered 2023-11-23: 3000 [IU] via INTRAVENOUS

## 2023-11-23 SURGICAL SUPPLY — 20 items
BALLOON EMERGE MR 2.0X12 (BALLOONS) IMPLANT
BALLOON EMERGE MR 2.5X12 (BALLOONS) IMPLANT
BALLOON SAPPHIRE NC24 3.0X12 (BALLOONS) IMPLANT
BALLOON WOLVERINE 3.00X15 (BALLOONS) IMPLANT
BALLOON ~~LOC~~ EMERGE MR 3.5X15 (BALLOONS) IMPLANT
BALLOON ~~LOC~~ EMERGE MR 4.0X12 (BALLOONS) IMPLANT
CATH DRAGONFLY OPSTAR (CATHETERS) IMPLANT
CATH INFINITI 5FR ANG PIGTAIL (CATHETERS) IMPLANT
CATH INFINITI AMBI 6FR TG (CATHETERS) IMPLANT
CATH LAUNCHER 6FR EBU3.5 (CATHETERS) IMPLANT
DEVICE RAD COMP TR BAND LRG (VASCULAR PRODUCTS) IMPLANT
GLIDESHEATH SLEND SS 6F .021 (SHEATH) IMPLANT
KIT ENCORE 26 ADVANTAGE (KITS) IMPLANT
PACK CARDIAC CATHETERIZATION (CUSTOM PROCEDURE TRAY) ×1 IMPLANT
SET ATX-X65L (MISCELLANEOUS) IMPLANT
STENT SYNERGY XD 3.0X48 (Permanent Stent) IMPLANT
STENT SYNERGY XD 3.50X12 (Permanent Stent) IMPLANT
WIRE ASAHI PROWATER 180CM (WIRE) IMPLANT
WIRE EMERALD 3MM-J .035X260CM (WIRE) IMPLANT
WIRE MICROINTRODUCER 60CM (WIRE) IMPLANT

## 2023-11-23 NOTE — Progress Notes (Signed)
 CARDIAC REHAB PHASE I     Post stent education including site care, restrictions, risk factors, exercise guidelines, NTG use, antiplatelet therapy importance, heart healthy diet and CRP2 reviewed. All questions and concerns addressed. Will refer to AP for CRP2. Plan for home later today.    [id number] Ronny Colas, RN BSN 11/23/2023 2:43 PM

## 2023-11-23 NOTE — Progress Notes (Signed)
 Patient and daughter was given discharge instructions. Both verbalized understanding.

## 2023-11-23 NOTE — Discharge Summary (Addendum)
 Discharge Summary for Same Day PCI   Patient ID: Robert Curtis MRN: [id number]; DOB: [birth year]  Admit date: 11/23/2023 Discharge date: 11/23/2023  Primary Care Provider: Minus Amel, MD  Primary Cardiologist: Oneil Bigness, MD  Primary Electrophysiologist:  None   Discharge Diagnoses    Principal Problem:   Chest pain Active Problems:   CAD (coronary artery disease)  Diagnostic Studies/Procedures    Cardiac Catheterization 11/23/2023:    Prox LAD-1 lesion is 90% stenosed.   Prox LAD-2 lesion is 90% stenosed.   1st Mrg lesion is 30% stenosed.   A stent was successfully placed.   Post intervention, there is a 0% residual stenosis.   Post intervention, there is a 0% residual stenosis.   1.  High-grade proximal LAD disease treated with Cutting Balloon angioplasty, 2 overlapping drug-eluting stents, and OCT optimization. 2.  LVEDP of 23 mmHg.   Recommendation: 6 hours of observation, same-day discharge for PCI, 6 months of dual antiplatelet therapy followed by Plavix monotherapy indefinitely.  Diagnostic Dominance: Right  Intervention   _____________   History of Present Illness     Robert Curtis is a 63 y.o. male with with hypertension, hyperlipidemia, and obesity who presented to the office with chest pain. He was referred by Dr. Glady Laming for evaluation of chest pain.   He had been experiencing chest pain for over a week, initially triggered by walking up a steep incline. The pain was described as a tightness and dull aching sensation, akin to being in a 'vice grip'. It typically lasts a couple of minutes and resolves with rest. A similar episode occurred at work, which did not resolve quickly, prompting him to seek medical attention. He has continued to have daily chest discomfort with exertion.    He has a history of premature ventricular contractions (PVCs) diagnosed 30 years ago, managed by avoiding caffeine and Sudafed. He has not had issues with PVCs  for a long time until the recent episodes of chest pain.   The chest discomfort occurred with exertion, such as walking or physical activity, and resolved with rest. He did not experience chest pain while sitting at home but did experience discomfort upon arriving at the clinic after walking around the building.   He takes medication for hypertension but no cholesterol medication. He had been taking a full dose aspirin  instead of a baby aspirin . He also takes Celebrex for fibromyalgia and arthritis.   His family history is significant for heart disease, with his father's mother and his mother having heart issues. His mother had atrial fibrillation. He does not smoke and rarely consumes alcohol.   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   EKG in the office showed nonspecific ST depression and symptoms concerning of coronary disease. Cardiac catheterization was arranged for further evaluation.  Hospital Course     The patient underwent cardiac cath as noted above with high grade pLAD lesion treated with cutting balloon angioplasty, 2 overlapping DES and OCT guidance. Plan for DAPT with ASA/plavix for at least 6 months then plavix monotherapy. The patient was seen by cardiac rehab while in short stay. There were no observed complications post cath. Radial cath site was re-evaluated prior to discharge and found to be stable without any complications. Instructions/precautions regarding cath site care were given prior to discharge.  Robert Curtis was seen by Dr. Lorie Rook and determined stable for discharge home. Follow up with our office has been  arranged. Medications are listed below. Pertinent changes include addition of plavix, crestor 40mg  daily, protonix. Of not he is on Celebrex and reports he is unable to stop with his pain, therefore added protonix.  _____________  Cath/PCI Registry Performance & Quality Measures: Aspirin  prescribed? - Yes ADP  Receptor Inhibitor (Plavix/Clopidogrel, Brilinta/Ticagrelor or Effient/Prasugrel) prescribed (includes medically managed patients)? - Yes High Intensity Statin (Lipitor 40-80mg  or Crestor 20-40mg ) prescribed? - Yes For EF <40%, was ACEI/ARB prescribed? - Not Applicable (EF >/= 40%) For EF <40%, Aldosterone Antagonist (Spironolactone or Eplerenone) prescribed? - Not Applicable (EF >/= 40%) Cardiac Rehab Phase II ordered (Included Medically managed Patients)? - Yes  _____________  Discharge Vitals Blood pressure 124/83, pulse (!) 59, temperature 98.6 F (37 C), temperature source Oral, resp. rate 16, height 5\' 7"  (1.702 m), weight 129.7 kg, SpO2 93%.  Filed Weights   11/23/23 0712  Weight: 129.7 kg    Last Labs & Radiologic Studies    CBC No results for input(s): "WBC", "NEUTROABS", "HGB", "HCT", "MCV", "PLT" in the last 72 hours. Basic Metabolic Panel No results for input(s): "NA", "K", "CL", "CO2", "GLUCOSE", "BUN", "CREATININE", "CALCIUM", "MG", "PHOS" in the last 72 hours. Liver Function Tests No results for input(s): "AST", "ALT", "ALKPHOS", "BILITOT", "PROT", "ALBUMIN" in the last 72 hours. No results for input(s): "LIPASE", "AMYLASE" in the last 72 hours. High Sensitivity Troponin:   No results for input(s): "TROPONINIHS" in the last 720 hours.  BNP Invalid input(s): "POCBNP" D-Dimer No results for input(s): "DDIMER" in the last 72 hours. Hemoglobin A1C No results for input(s): "HGBA1C" in the last 72 hours. Fasting Lipid Panel No results for input(s): "CHOL", "HDL", "LDLCALC", "TRIG", "CHOLHDL", "LDLDIRECT" in the last 72 hours. Thyroid Function Tests No results for input(s): "TSH", "T4TOTAL", "T3FREE", "THYROIDAB" in the last 72 hours.  Invalid input(s): "FREET3" _____________  CARDIAC CATHETERIZATION Result Date: 11/23/2023   Prox LAD-1 lesion is 90% stenosed.   Prox LAD-2 lesion is 90% stenosed.   1st Mrg lesion is 30% stenosed.   A stent was successfully placed.    Post intervention, there is a 0% residual stenosis.   Post intervention, there is a 0% residual stenosis. 1.  High-grade proximal LAD disease treated with Cutting Balloon angioplasty, 2 overlapping drug-eluting stents, and OCT optimization. 2.  LVEDP of 23 mmHg. Recommendation: 6 hours of observation, same-day discharge for PCI, 6 months of dual antiplatelet therapy followed by Plavix monotherapy indefinitely.    Disposition   Pt is being discharged home today in good condition.  Follow-up Plans & Appointments     Discharge Instructions     AMB Referral to Cardiac Rehabilitation - Phase II   Complete by: As directed    Diagnosis: Coronary Stents   After initial evaluation and assessments completed: Virtual Based Care may be provided alone or in conjunction with Phase 2 Cardiac Rehab based on patient barriers.: Yes   Intensive Cardiac Rehabilitation (ICR) [facility] location only OR Traditional Cardiac Rehabilitation (TCR) *If criteria for ICR are not met will enroll in TCR [facility] only): Yes        Discharge Medications   Allergies as of 11/23/2023       Reactions   Tetracyclines & Related Nausea Only        Medication List     TAKE these medications    APPLE CIDER VINEGAR PO Take 1 tablet by mouth 2 (two) times daily.   aspirin  EC 81 MG tablet Take 1 tablet (81 mg total) by mouth daily.  celecoxib 200 MG capsule Commonly known as: CELEBREX Take 200 mg by mouth 2 (two) times daily as needed for moderate pain (pain score 4-6).   clopidogrel 75 MG tablet Commonly known as: Plavix Take 1 tablet (75 mg total) by mouth daily.   DULoxetine 60 MG capsule Commonly known as: CYMBALTA Take 60 mg by mouth daily.   gabapentin 300 MG capsule Commonly known as: NEURONTIN Take 300 mg by mouth 3 (three) times daily.   ipratropium 0.03 % nasal spray Commonly known as: ATROVENT Place 2 sprays into both nostrils 2 (two) times daily as needed for rhinitis.   metoprolol  tartrate 25  MG tablet Commonly known as: LOPRESSOR  Take 1 tablet (25 mg total) by mouth 2 (two) times daily.   nitroGLYCERIN  0.4 MG SL tablet Commonly known as: NITROSTAT  Place 1 tablet (0.4 mg total) under the tongue every 5 (five) minutes as needed for chest pain.   olmesartan-hydrochlorothiazide 40-25 MG tablet Commonly known as: BENICAR HCT Take 1 tablet by mouth daily.   pantoprazole 40 MG tablet Commonly known as: Protonix Take 1 tablet (40 mg total) by mouth daily.   rosuvastatin 40 MG tablet Commonly known as: Crestor Take 1 tablet (40 mg total) by mouth daily.          Allergies Allergies  Allergen Reactions   Tetracyclines & Related Nausea Only    Outstanding Labs/Studies   FLP/LFTs in 8 weeks   Duration of Discharge Encounter   Greater than 30 minutes including physician time.  Signed, Johnie Nailer, NP 11/23/2023, 1:14 PM  ATTENDING ATTESTATION:  After conducting a review of all available clinical information with the care team, interviewing the patient, and performing a physical exam, I agree with the findings and plan described in this note.   GEN: No acute distress.   HEENT:  MMM, no JVD, no scleral icterus Cardiac: RRR, no murmurs, rubs, or gallops.  Respiratory: Clear to auscultation bilaterally. GI: Soft, nontender, non-distended  MS: No edema; No deformity. Neuro:  Nonfocal  Vasc:  +2 radial pulses  Patient is doing well after uncomplicated elective PCI.  I's discussed the need for dual antiplatelet therapy and indefinite Plavix monotherapy after 6 months, the need for lipid-lowering, hypertension management, access site management, and longitudinal care for his cardiovascular disease.  I discussed this over the span of approximately 20 minutes with the patient and his wife.  All Questions were answered.  APP discharge time:15 MD discharge time:20  Alyssa Backbone, MD Pager [phone]

## 2023-11-23 NOTE — Interval H&P Note (Signed)
 History and Physical Interval Note:  11/23/2023 7:00 AM  Robert Curtis  has presented today for surgery, with the diagnosis of chest pain - unstable angina.  The various methods of treatment have been discussed with the patient and family. After consideration of risks, benefits and other options for treatment, the patient has consented to  Procedure(s): LEFT HEART CATH AND CORONARY ANGIOGRAPHY (N/A) as a surgical intervention.  The patient's history has been reviewed, patient examined, no change in status, stable for surgery.  I have reviewed the patient's chart and labs.  Questions were answered to the patient's satisfaction.     Abigayl Hor K Brody Kump

## 2023-11-23 NOTE — Discharge Instructions (Signed)

## 2023-11-24 ENCOUNTER — Encounter (HOSPITAL_COMMUNITY): Payer: Self-pay | Admitting: Internal Medicine

## 2023-11-24 NOTE — Progress Notes (Signed)
 Morbid obesity   Obesity class III.  Melodee Spruce T. Rolm Clos, MD, [facility]  [facility]  [address] [place], [place] [address] [phone]  1:06 PM

## 2023-12-02 ENCOUNTER — Ambulatory Visit (HOSPITAL_COMMUNITY)
Admission: RE | Admit: 2023-12-02 | Discharge: 2023-12-02 | Disposition: A | Discharge status: Home / Self Care | Source: Ambulatory Visit | Attending: Cardiovascular Disease | Admitting: Cardiovascular Disease

## 2023-12-02 ENCOUNTER — Encounter: Payer: Self-pay | Admitting: Cardiovascular Disease

## 2023-12-02 DIAGNOSIS — R072 Precordial pain: Secondary | ICD-10-CM | POA: Insufficient documentation

## 2023-12-02 LAB — ECHOCARDIOGRAM COMPLETE
AR max vel: 1.96 cm2
AV Area VTI: 2.58 cm2
AV Area mean vel: 2.05 cm2
AV Mean grad: 4 mmHg
AV Peak grad: 9.1 mmHg
Ao pk vel: 1.51 m/s
Area-P 1/2: 3.74 cm2
S' Lateral: 3.1 cm

## 2023-12-08 ENCOUNTER — Ambulatory Visit: Payer: Self-pay

## 2023-12-10 ENCOUNTER — Ambulatory Visit: Payer: Self-pay

## 2023-12-10 ENCOUNTER — Ambulatory Visit: Attending: Cardiology | Admitting: Cardiology

## 2023-12-10 ENCOUNTER — Encounter: Payer: Self-pay | Admitting: Cardiology

## 2023-12-10 VITALS — BP 122/80 | HR 55 | Ht 67.0 in | Wt 295.8 lb

## 2023-12-10 DIAGNOSIS — E782 Mixed hyperlipidemia: Secondary | ICD-10-CM | POA: Diagnosis not present

## 2023-12-10 DIAGNOSIS — I2510 Atherosclerotic heart disease of native coronary artery without angina pectoris: Secondary | ICD-10-CM

## 2023-12-10 DIAGNOSIS — R072 Precordial pain: Secondary | ICD-10-CM | POA: Diagnosis not present

## 2023-12-10 DIAGNOSIS — E669 Obesity, unspecified: Secondary | ICD-10-CM | POA: Diagnosis not present

## 2023-12-10 MED ORDER — CLOPIDOGREL BISULFATE 75 MG PO TABS: 75.0000 mg | ORAL_TABLET | Freq: Every day | ORAL | 3 refills | Status: DC

## 2023-12-10 MED ORDER — PANTOPRAZOLE SODIUM 40 MG PO TBEC
40.0000 mg | DELAYED_RELEASE_TABLET | Freq: Every day | ORAL | 3 refills | Status: DC
Start: 2023-12-10 — End: 2024-04-26

## 2023-12-10 MED ORDER — ROSUVASTATIN CALCIUM 40 MG PO TABS: 40.0000 mg | ORAL_TABLET | Freq: Every day | ORAL | 3 refills | Status: DC

## 2023-12-10 NOTE — Progress Notes (Signed)
 Cardiology Office Note:   Date:  12/10/2023  ID:  Robert Curtis, DOB 1961-07-17, MRN [id number] PCP: Minus Amel, MD  [facility] Providers Cardiologist:  Oneil Bigness, MD    History of Present Illness:   Discussed the use of AI scribe software for clinical note transcription with the patient, who gave verbal consent to proceed.  History of Present Illness Robert Curtis is a 63 year old male with coronary artery disease who presents for follow-up after stent placement.  He initially experienced chest pain a week prior to his first visit, triggered by walking up a steep incline. The pain was described as a tightness and dull aching sensation, similar to being in a 'vice grip'. It lasted a couple of minutes and resolved with rest. A subsequent episode at work did not resolve as quickly, prompting him to seek medical attention.  He underwent cardiac catheterization on November 23, 2023, revealing high-grade proximal LAD disease treated with Cutting Balloon angioplasty, 2 overlapping drug-eluting stents, and OCT optimization.  He was started on dual antiplatelet therapy with aspirin  and Plavix . Since the stent placement, he feels 'pretty good' but still experiences tiredness, which was present before the procedure. No recurrence of chest pain or tightness since the stents were placed.  He reports a bruise on his arm that he 'couldn't explain' and some swelling, which he managed with a heating pad and ice. He experienced discomfort in the arm for the first couple of days post-procedure but no sharp pains. He has not experienced any issues with bleeding, bloody urine, or stools. He carries nitroglycerin  but has not needed to use it.  He is currently taking aspirin  81 mg, Plavix  75 mg, metoprolol , Crestor , and Benicar 40/25 mg. He was not on cholesterol medication prior to his initial visit but started Crestor  after PCI. His cholesterol was last checked in October of the previous year,  showing high total cholesterol and LDL levels.  He mentions a previous prescription for Ozempic, which was not covered by insurance, and inquires if his recent cardiac events might change insurance coverage for this medication.  No lightheadedness or dizziness with metoprolol , and no issues with swelling in his legs since the heart procedure, although he had swelling before. No bloody urine or stools.  Patient and spouse today express concern about sexual stamina and ED.   Today patient denies chest pain, shortness of breath, lower extremity edema, palpitations, melena, hematuria, hemoptysis, diaphoresis, weakness, presyncope, syncope, orthopnea, and PND.   Studies Reviewed:    EKG:   EKG Interpretation Date/Time:  Friday Dec 10 2023 08:18:55 EDT Ventricular Rate:  54 PR Interval:  148 QRS Duration:  86 QT Interval:  432 QTC Calculation: 409 R Axis:   0  Text Interpretation: Sinus bradycardia When compared with ECG of 23-Nov-2023 10:58, No significant change was found Confirmed by Leala Prince [phone]) on 12/10/2023 8:34:13 AM    Cardiac Catheterization 11/23/2023:     Prox LAD-1 lesion is 90% stenosed.   Prox LAD-2 lesion is 90% stenosed.   1st Mrg lesion is 30% stenosed.   A stent was successfully placed.   Post intervention, there is a 0% residual stenosis.   Post intervention, there is a 0% residual stenosis.   1.  High-grade proximal LAD disease treated with Cutting Balloon angioplasty, 2 overlapping drug-eluting stents, and OCT optimization. 2.  LVEDP of 23 mmHg.   Recommendation: 6 hours of observation, same-day discharge for PCI, 6 months of dual antiplatelet therapy followed  by Plavix  monotherapy indefinitely.   Diagnostic Dominance: Right  Intervention     12/02/23 TTE  IMPRESSIONS     1. Left ventricular ejection fraction, by estimation, is 60 to 65%. The  left ventricle has normal function. Left ventricular endocardial border  not optimally defined to  evaluate regional wall motion. There is mild left  ventricular hypertrophy. Left  ventricular diastolic parameters were normal.   2. Right ventricular systolic function is normal. The right ventricular  size is normal. Tricuspid regurgitation signal is inadequate for assessing  PA pressure.   3. The mitral valve is grossly normal. No evidence of mitral valve  regurgitation. No evidence of mitral stenosis.   4. The aortic valve was not well visualized. Aortic valve regurgitation  is not visualized. No aortic stenosis is present.   5. The inferior vena cava is normal in size but collapsibility could not  be evaluated.   Comparison(s): No prior Echocardiogram.   FINDINGS   Left Ventricle: Left ventricular ejection fraction, by estimation, is 60  to 65%. The left ventricle has normal function. Left ventricular  endocardial border not optimally defined to evaluate regional wall motion.  Strain was performed and the global  longitudinal strain is indeterminate. The left ventricular internal cavity  size was normal in size. There is mild left ventricular hypertrophy. Left  ventricular diastolic parameters were normal.   Right Ventricle: The right ventricular size is normal. No increase in  right ventricular wall thickness. Right ventricular systolic function is  normal. Tricuspid regurgitation signal is inadequate for assessing PA  pressure.   Left Atrium: Left atrial size was normal in size.   Right Atrium: Right atrial size was normal in size.   Pericardium: There is no evidence of pericardial effusion.   Mitral Valve: The mitral valve is grossly normal. No evidence of mitral  valve regurgitation. No evidence of mitral valve stenosis.   Tricuspid Valve: The tricuspid valve is not well visualized. Tricuspid  valve regurgitation is not demonstrated. No evidence of tricuspid  stenosis.   Aortic Valve: The aortic valve was not well visualized. Aortic valve  regurgitation is not  visualized. No aortic stenosis is present. Aortic  valve mean gradient measures 4.0 mmHg. Aortic valve peak gradient measures  9.1 mmHg. Aortic valve area, by VTI  measures 2.58 cm.   Pulmonic Valve: The pulmonic valve was not well visualized. Pulmonic valve  regurgitation is not visualized. No evidence of pulmonic stenosis.   Aorta: The aortic root is normal in size and structure.   Venous: The inferior vena cava is normal in size but collapsibility could  not be evaluated.   IAS/Shunts: No atrial level shunt detected by color flow Doppler.   Additional Comments: 3D was performed not requiring image post processing  on an independent workstation and was indeterminate.   Risk Assessment/Calculations:              Physical Exam:   VS:  BP 122/80   Pulse (!) 55   Ht 5\' 7"  (1.702 m)   Wt 295 lb 12.8 oz (134.2 kg)   SpO2 96%   BMI 46.33 kg/m    Wt Readings from Last 3 Encounters:  12/10/23 295 lb 12.8 oz (134.2 kg)  11/23/23 286 lb (129.7 kg)  11/11/23 291 lb 9.6 oz (132.3 kg)     Physical Exam Vitals reviewed.  Constitutional:      Appearance: Normal appearance.  HENT:     Head: Normocephalic.  Eyes:  Pupils: Pupils are equal, round, and reactive to light.  Cardiovascular:     Rate and Rhythm: Normal rate and regular rhythm.     Pulses: Normal pulses.     Heart sounds: Normal heart sounds.  Pulmonary:     Effort: Pulmonary effort is normal.     Breath sounds: Normal breath sounds.  Abdominal:     General: Abdomen is flat.     Palpations: Abdomen is soft.  Musculoskeletal:     Right lower leg: Edema (trace) present.     Left lower leg: Edema (trace) present.  Skin:    General: Skin is warm and dry.     Capillary Refill: Capillary refill takes less than 2 seconds.     Findings: Bruising (faint on right forearm) present.  Neurological:     General: No focal deficit present.     Mental Status: He is alert and oriented to person, place, and time.   Psychiatric:        Mood and Affect: Mood normal.        Behavior: Behavior normal.        Thought Content: Thought content normal.        Judgment: Judgment normal.    ASSESSMENT AND PLAN:    Assessment & Plan Coronary artery disease with stent placement Status post cardiac catheterization on November 23, 2023, revealing 90% stenosis of proximal LAD1 lesion and 90% stenosis prox LAD2 lesion, with a first marginal lesion showing 30% stenosis and minimal disease in RCA. Underwent cutting balloon angioplasty and placement of two overlapping drug-eluting stents. Currently on dual antiplatelet therapy with aspirin  and Plavix . No recurrence of chest tightness or discomfort post-stent placement. Bruising noted, likely due to dual antiplatelet therapy, but no significant bleeding issues reported.  - Continue aspirin  81 mg daily and Plavix  75 mg daily for at least 6 months. Plan for Plavix  75mg  monotherapy following DAPT. - Continue Metoprolol  Tartrate 25mg  BID - Educate on avoiding nitroglycerin  within 24 hours of taking sildenafil or 48 hours of taking tadalafil  Hypertension BP well controlled in clinic today. - Continue Benicar 40-25mg  once daily - Continue Metoprolol  Tartrate 25mg  BID  Hyperlipidemia Previously high total cholesterol and LDL levels, with LDL at 152 mg/dL as of October 1610. Initiated on rosuvastatin  post-stent placement to reduce LDL levels, aiming for a target LDL of 55 mg/dL. No recent cholesterol levels available, as recent labs did not include lipid panel. Discussed how statins are standard of care post-stent to reduce restenosis risk. - Continue rosuvastatin  40mg  - Order lipid panel in 2 months to assess response to rosuvastatin . Suspect he will need adjuvant therapy to reach LDL goal, high risk patient. - Refer to a nutritionist for dietary management of cholesterol  Mild lower extremity swelling Intermittent leg swelling, likely related to venous insufficiency and  weight. No signs of congestive heart failure.  - Advise on leg elevation and use of compression socks to manage swelling        Cardiac Rehabilitation Eligibility Assessment  The patient is ready to start cardiac rehabilitation from a cardiac standpoint.        Signed, Leala Prince, PA-C

## 2023-12-10 NOTE — Patient Instructions (Addendum)
 Medication Instructions:  Your physician recommends that you continue on your current medications as directed. Please refer to the Current Medication list given to you today.  Refills for Pantoprazole  (Protonix ), Clopidogrel  (Plavix ), and Rosuvastatin  (Crestor ) have been sent to your pharmacy.   *If you need a refill on your cardiac medications before your next appointment, please call your pharmacy*  Lab Work: To be completed in 2 months: FASTING lipid panel and liver function panel (approximately 02/09/24)  If you have labs (blood work) drawn today and your tests are completely normal, you will receive your results only by: MyChart Message (if you have MyChart) OR A paper copy in the mail If you have any lab test that is abnormal or we need to change your treatment, we will call you to review the results.  Testing/Procedures: None ordered today.  Follow-Up: At [facility], you and your health needs are our priority.  As part of our continuing mission to provide you with exceptional heart care, our providers are all part of one team.  This team includes your primary Cardiologist (physician) and Advanced Practice Providers or APPs (Physician Assistants and Nurse Practitioners) who all work together to provide you with the care you need, when you need it.  Your next appointment:   3 month(s)  The format for your next appointment:   In Person  Provider:   Dr. Rolm Clos  We recommend signing up for the patient portal called "MyChart".  Sign up information is provided on this After Visit Summary.  MyChart is used to connect with patients for Virtual Visits (Telemedicine).  Patients are able to view lab/test results, encounter notes, upcoming appointments, etc.  Non-urgent messages can be sent to your provider as well.   To learn more about what you can do with MyChart, go to [email or web address].   Other Instructions You have been referred to a nutritionist by your provider.  Someone will be reaching out to you to schedule this appointment.

## 2023-12-24 ENCOUNTER — Ambulatory Visit: Admitting: Cardiovascular Disease

## 2024-01-06 ENCOUNTER — Encounter: Attending: Family Medicine | Admitting: Nutrition

## 2024-01-06 VITALS — Ht 67.0 in | Wt 293.0 lb

## 2024-01-06 DIAGNOSIS — E782 Mixed hyperlipidemia: Secondary | ICD-10-CM | POA: Insufficient documentation

## 2024-01-06 DIAGNOSIS — E66813 Obesity, class 3: Secondary | ICD-10-CM | POA: Diagnosis present

## 2024-01-06 NOTE — Patient Instructions (Addendum)
 Goals  Eat breakfast daily. Choose egg sandwich or oatmeal with fruit Start walking 30 minutes 3 times per week. Don't skip breakfast Limit fast foods and drive thru's to 3 times per week. Lose 1 lb per week. Watch the full plate living program.

## 2024-01-06 NOTE — Progress Notes (Signed)
 Medical Nutrition Therapy  Appointment Start time:  1325  Appointment End time:  1430  Primary concerns today: Obesity  Referral diagnosis: E66.01 Preferred learning style: No Preference  Learning readiness: Ready    NUTRITION ASSESSMENT  63 yr old wmale referred for severe obesity. PCP Dr. Glady Laming. Referral from Leala Prince, [facility]. Had stents put in May 2025 for blockages. He is wanting to lose weight and prevent a heart attack or other health issues. In 2002 he weighed 100 lbs less-about 198 lbs. 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 Sometimes works 10-12 hr days.  He and his wife are caring for aging parents- he cares for his dad and his wife cares for her mother. His wife is out of town for 2 weeks at a time.  He is eating a lot of fast food typically after work. He goes home for lunch. He is eating some microwave meals- Healthy Choice at times.   Had stentes put in May 2025 He is now on Crestor . See lipid labs. PA in Cardiology He doesn't have an A1C that I am aware of. Needs an A1C done to rule out Type 2 DM. Numerous risk factors. He is willing to work on making better food choices, meal prepping some with the help of his wife and start walking when he can. Clinical Medical Hx:  Past Medical History:  Diagnosis Date   Fatigue    Hyperlipidemia    Hypertension    Snoring     Medications:  Current Outpatient Medications on File Prior to Visit  Medication Sig Dispense Refill   APPLE CIDER VINEGAR PO Take 1 tablet by mouth 2 (two) times daily.     aspirin  EC 81 MG tablet Take 1 tablet (81 mg total) by mouth daily.     celecoxib (CELEBREX) 200 MG capsule Take 200 mg by mouth 2 (two) times daily as needed for moderate pain (pain score 4-6).     clopidogrel  (PLAVIX ) 75 MG tablet Take 1 tablet (75 mg total) by mouth daily. 30  tablet 3   DULoxetine (CYMBALTA) 60 MG capsule Take 60 mg by mouth daily.     gabapentin (NEURONTIN) 300 MG capsule Take 300 mg by mouth 3 (three) times daily.     metoprolol  tartrate (LOPRESSOR ) 25 MG tablet Take 1 tablet (25 mg total) by mouth 2 (two) times daily. 180 tablet 3   olmesartan-hydrochlorothiazide (BENICAR HCT) 40-25 MG tablet Take 1 tablet by mouth daily.     pantoprazole  (PROTONIX ) 40 MG tablet Take 1 tablet (40 mg total) by mouth daily. 30 tablet 3   rosuvastatin  (CRESTOR ) 40 MG tablet Take 1 tablet (40 mg total) by mouth daily. 30 tablet 3   ipratropium (ATROVENT) 0.03 % nasal spray Place 2 sprays into both nostrils 2 (two) times daily as needed for rhinitis. (Patient not taking: Reported on 01/06/2024)     nitroGLYCERIN  (NITROSTAT ) 0.4 MG SL tablet Place 1 tablet (0.4 mg total) under the tongue every 5 (five) minutes as needed for chest pain. 25 tablet 6   No current facility-administered medications on file prior to visit.    Labs:     Latest Ref Rng & Units 11/11/2023    3:05 PM 06/10/2016    5:02 PM  CMP  Glucose 70 - 99 mg/dL 540    BUN  8 - 27 mg/dL 17    Creatinine 9.60 - 1.27 mg/dL 4.54  0.98   Sodium 119 - 144 mmol/L 144    Potassium 3.5 - 5.2 mmol/L 3.9    Chloride 96 - 106 mmol/L 101    CO2 20 - 29 mmol/L 29    Calcium  8.6 - 10.2 mg/dL 14.7     Lipids 82/9562 TCHOL 223 mg/dl TG 130 mg/dl HDL 39 mg/dl LDL 865 mg/dl     Notable Signs/Symptoms: Fatigue,   Lifestyle & Dietary Hx Currently Cares for his father and MIL  Estimated daily fluid intake: 40 oz Supplements:  Sleep: OSA Stress / self-care: caring for aging parents, working long hours Current average weekly physical activity: ADL  24-Hr Dietary Recall First Meal: skips Snack:  Second Meal: Helathy choice Snack:  Third Meal: Healthy Choice or drive thru Snack: Trying to avoid snacks Beverages: water, sodas, tea  Estimated Energy Needs Calories: 1600 Carbohydrate: 180g Protein:  120g Fat: 44g   NUTRITION DIAGNOSIS  NI-1.7 Predicted excessive energy intake As related to high calorie diet from fat, salt and sugar processed foods.  As evidenced by BMI 45, hyperlipidemia and recent stents put in.Robert Curtis   NUTRITION INTERVENTION  Nutrition education (E-1) on the following topics:  Lifestyle Medicine  - Whole Food, Plant Predominant Nutrition is highly recommended: Eat Plenty of vegetables, Mushrooms, fruits, Legumes, Whole Grains, Nuts, seeds in lieu of processed meats, processed snacks/pastries red meat, poultry, eggs.    -It is better to avoid simple carbohydrates including: Cakes, Sweet Desserts, Ice Cream, Soda (diet and regular), Sweet Tea, Candies, Chips, Cookies, Store Bought Juices, Alcohol in Excess of  1-2 drinks a day, Lemonade,  Artificial Sweeteners, Doughnuts, Coffee Creamers, Sugar-free Products, etc, etc.  This is not a complete list.....  Exercise: If you are able: 30 -60 minutes a day ,4 days a week, or 150 minutes a week.  The longer the better.  Combine stretch, strength, and aerobic activities.  If you were told in the past that you have high risk for cardiovascular diseases, you may seek evaluation by your heart doctor prior to initiating moderate to intense exercise programs.   Handouts Provided Include  Lifestyle Medicine Handouts   Learning Style & Readiness for Change Teaching method utilized: Visual & Auditory  Demonstrated degree of understanding via: Teach Back  Barriers to learning/adherence to lifestyle change: none  Goals Established by Pt Goals  Eat breakfast daily. Choose egg sandwich or oatmeal with fruit Start walking 30 minutes 3 times per week. Don't skip breakfast Limit fast foods and drive thru's to 3 times per week. Lose 1 lb per week. Watch the full plate living program.   MONITORING & EVALUATION Dietary intake, weekly physical activity, and weight in 1 month.  Next Steps  Patient is to work on eating a more whole  plant based diet and lifestyle .Robert Curtis

## 2024-01-18 ENCOUNTER — Ambulatory Visit: Admitting: Internal Medicine

## 2024-02-15 ENCOUNTER — Encounter: Attending: Cardiology | Admitting: Nutrition

## 2024-02-15 VITALS — Ht 67.0 in | Wt 295.0 lb

## 2024-02-15 DIAGNOSIS — E66813 Obesity, class 3: Secondary | ICD-10-CM | POA: Insufficient documentation

## 2024-02-15 DIAGNOSIS — E782 Mixed hyperlipidemia: Secondary | ICD-10-CM | POA: Diagnosis present

## 2024-02-15 NOTE — Progress Notes (Signed)
 Medical Nutrition Therapy  Appointment Start time:  1600  Appointment End time:  1630  Primary concerns today: Obesity  Referral diagnosis: E66.01 Preferred learning style: No Preference  Learning readiness: Ready    NUTRITION ASSESSMENT CVD, Obesity follow up  63 yr old wmale referred for severe obesity. PCP Dr. Marvine. Referral from [facility]. Had stents put in May 2025 for blockages.  I've done a little bit better. I am not eating out as much and trying to eat earlier. No new labs.  Gained 2 lbs. Still struggles with meal planing and  cooking since his wife is not home most of the time.  Working on cutting out snacking, especially late at night. Not able to exercise much due to working long hours. Goes out to eat when his wife comes home. Is trying to purchase healthier options when eating out some of the time. Still struggles with eating a healthy breakfast. Thinks he needs to work on portion sizes better to take in less calories for needed weight loss.   Wt Readings from Last 3 Encounters:  02/15/24 295 lb (133.8 kg)  01/06/24 293 lb (132.9 kg)  12/10/23 295 lb 12.8 oz (134.2 kg)   Ht Readings from Last 3 Encounters:  02/15/24 5' 7 (1.702 m)  01/06/24 5' 7 (1.702 m)  12/10/23 5' 7 (1.702 m)   Body mass index is 46.2 kg/m. @BMIFA @ Facility age limit for growth %iles is 20 years. Facility age limit for growth %iles is 20 years.  Clinical Medical Hx:  Past Medical History:  Diagnosis Date   Fatigue    Hyperlipidemia    Hypertension    Snoring     Medications:  Current Outpatient Medications on File Prior to Visit  Medication Sig Dispense Refill   APPLE CIDER VINEGAR PO Take 1 tablet by mouth 2 (two) times daily.     aspirin  EC 81 MG tablet Take 1 tablet (81 mg total) by mouth daily.     celecoxib (CELEBREX) 200 MG capsule Take 200 mg by mouth 2 (two) times daily as needed for moderate pain (pain score 4-6).     clopidogrel  (PLAVIX ) 75  MG tablet Take 1 tablet (75 mg total) by mouth daily. 30 tablet 3   DULoxetine (CYMBALTA) 60 MG capsule Take 60 mg by mouth daily.     gabapentin (NEURONTIN) 300 MG capsule Take 300 mg by mouth 3 (three) times daily.     ipratropium (ATROVENT) 0.03 % nasal spray Place 2 sprays into both nostrils 2 (two) times daily as needed for rhinitis. (Patient not taking: Reported on 01/06/2024)     metoprolol  tartrate (LOPRESSOR ) 25 MG tablet Take 1 tablet (25 mg total) by mouth 2 (two) times daily. 180 tablet 3   nitroGLYCERIN  (NITROSTAT ) 0.4 MG SL tablet Place 1 tablet (0.4 mg total) under the tongue every 5 (five) minutes as needed for chest pain. 25 tablet 6   olmesartan-hydrochlorothiazide (BENICAR HCT) 40-25 MG tablet Take 1 tablet by mouth daily.     pantoprazole  (PROTONIX ) 40 MG tablet Take 1 tablet (40 mg total) by mouth daily. 30 tablet 3   rosuvastatin  (CRESTOR ) 40 MG tablet Take 1 tablet (40 mg total) by mouth daily. 30 tablet 3   No current facility-administered medications on file prior to visit.    Labs:     Latest Ref Rng & Units 11/11/2023    3:05 PM 06/10/2016    5:02 PM  CMP  Glucose 70 - 99 mg/dL 875  BUN 8 - 27 mg/dL 17    Creatinine 9.23 - 1.27 mg/dL 8.82  8.87   Sodium 865 - 144 mmol/L 144    Potassium 3.5 - 5.2 mmol/L 3.9    Chloride 96 - 106 mmol/L 101    CO2 20 - 29 mmol/L 29    Calcium  8.6 - 10.2 mg/dL 89.8     Lipids 89/7975 TCHOL 223 mg/dl TG 826 mg/dl HDL 39 mg/dl LDL 847 mg/dl     Notable Signs/Symptoms: Fatigue,   Lifestyle & Dietary Hx Currently Cares for his father and MIL  Estimated daily fluid intake: 40 oz Supplements:  Sleep: OSA Stress / self-care: caring for aging parents, working long hours Current average weekly physical activity: ADL  24-Hr Dietary Recall First Meal: skips Snack:  Second Meal: Helathy choice Snack:  Third Meal: Healthy Choice or drive thru Snack: Trying to avoid snacks Beverages: water, sodas, tea  Estimated  Energy Needs Calories: 1600 Carbohydrate: 180g Protein: 120g Fat: 44g   NUTRITION DIAGNOSIS  NI-1.7 Predicted excessive energy intake As related to high calorie diet from fat, salt and sugar processed foods.  As evidenced by BMI 45, hyperlipidemia and recent stents put in.SABRA   NUTRITION INTERVENTION  Nutrition education (E-1) on the following topics:  Lifestyle Medicine  - Whole Food, Plant Predominant Nutrition is highly recommended: Eat Plenty of vegetables, Mushrooms, fruits, Legumes, Whole Grains, Nuts, seeds in lieu of processed meats, processed snacks/pastries red meat, poultry, eggs.    -It is better to avoid simple carbohydrates including: Cakes, Sweet Desserts, Ice Cream, Soda (diet and regular), Sweet Tea, Candies, Chips, Cookies, Store Bought Juices, Alcohol in Excess of  1-2 drinks a day, Lemonade,  Artificial Sweeteners, Doughnuts, Coffee Creamers, Sugar-free Products, etc, etc.  This is not a complete list.....  Exercise: If you are able: 30 -60 minutes a day ,4 days a week, or 150 minutes a week.  The longer the better.  Combine stretch, strength, and aerobic activities.  If you were told in the past that you have high risk for cardiovascular diseases, you may seek evaluation by your heart doctor prior to initiating moderate to intense exercise programs.   Handouts Provided Include  Lifestyle Medicine Handouts   Learning Style & Readiness for Change Teaching method utilized: Visual & Auditory  Demonstrated degree of understanding via: Teach Back  Barriers to learning/adherence to lifestyle change: none  Goals Established by Pt Goals  Eat breakfast daily.-improved slowly Choose egg sandwich or oatmeal with fruit- done Start walking 30 minutes 3 times per week.-need to still do that. Don't skip breakfast- Limit fast foods and drive thru's to 3 times per week.-improved Lose 1 lb per week. Watch the full plate living program. -not done   MONITORING &  EVALUATION Dietary intake, weekly physical activity, and weight in 1 month.  Next Steps  Patient is to work on eating a more whole plant based diet and lifestyle .SABRA

## 2024-02-15 NOTE — Patient Instructions (Signed)
 Goals Work on eating breakfast daily. Keep working on portion [employer] working on eating more salads, vegetables and fruit instead of burgers, fries and empty calorie foods. Lose 5 lbs

## 2024-03-06 ENCOUNTER — Encounter: Payer: Self-pay | Admitting: Nutrition

## 2024-03-09 NOTE — Progress Notes (Signed)
 Cardiology Office Note:  .   Date:  03/10/2024  ID:  Robert Curtis, DOB November 12, 1960, MRN [id number] PCP: Marvine Rush, MD  [facility] Providers Cardiologist:  Darryle ONEIDA Decent, MD { History of Present Illness: .    Chief Complaint  Patient presents with   Follow-up    Robert Curtis is a 63 y.o. male with history of CAD who presents for follow-up.    History of Present Illness   Robert Curtis is a 63 year old male with coronary artery disease who presents for follow-up after stent placement.  Since the stent placement in April 2025, he has not experienced any recurrence of chest discomfort. Previously, extensive walking would trigger symptoms, but he no longer experiences these symptoms even with similar activity levels. His energy levels have improved, although he feels fatigued by late afternoon around 5:00 to 5:30 PM.  He has a history of sleep apnea and uses a CPAP machine regularly. His home blood pressure readings range from 120 to 140 mmHg, depending on his activity level. He attributes today's high reading to 'white coat syndrome'. He is currently on aspirin and Plavix. He is also on Crestor 40 mg daily.  He experiences erectile dysfunction, describing the ability to achieve but not maintain an erection. He is not taking nitroglycerin and has not tried medications like Viagra. He is on metoprolol.  He experiences swelling in his feet and ankles and notes a difference in sensation between his feet when exposed to hot water. He reports no pain in his legs but has fibromyalgia, which complicates symptom assessment.  He is seeing a dietitian and is trying to follow dietary recommendations, including watching sodium intake.          Problem List CAD -[place]  10/2023 -> PCI pLAD Obesity 2. HLD -T chol 223, HDL 39, LDL 152, TG 173 3. HTN 4. Fibromyalgia  5. OSA    ROS: All other ROS reviewed and negative. Pertinent positives noted in the HPI.     Studies  Reviewed: SABRA       LHC 11/23/2023 1.  High-grade proximal LAD disease treated with Cutting Balloon angioplasty, 2 overlapping drug-eluting stents, and OCT optimization. 2.  LVEDP of 23 mmHg.  TTE 12/02/2023  1. Left ventricular ejection fraction, by estimation, is 60 to 65%. The  left ventricle has normal function. Left ventricular endocardial border  not optimally defined to evaluate regional wall motion. There is mild left  ventricular hypertrophy. Left  ventricular diastolic parameters were normal.   2. Right ventricular systolic function is normal. The right ventricular  size is normal. Tricuspid regurgitation signal is inadequate for assessing  PA pressure.   3. The mitral valve is grossly normal. No evidence of mitral valve  regurgitation. No evidence of mitral stenosis.   4. The aortic valve was not well visualized. Aortic valve regurgitation  is not visualized. No aortic stenosis is present.   5. The inferior vena cava is normal in size but collapsibility could not  be evaluated.   Physical Exam:   VS:  BP (!) 171/98   Pulse (!) 57   Ht 5' 7 (1.702 m)   Wt 294 lb 3.2 oz (133.4 kg)   SpO2 93%   BMI 46.08 kg/m    Wt Readings from Last 3 Encounters:  03/10/24 294 lb 3.2 oz (133.4 kg)  02/15/24 295 lb (133.8 kg)  01/06/24 293 lb (132.9 kg)    GEN: Well nourished, well developed in no  acute distress NECK: No JVD; No carotid bruits CARDIAC: RRR, no murmurs, rubs, gallops RESPIRATORY:  Clear to auscultation without rales, wheezing or rhonchi  ABDOMEN: Soft, non-tender, non-distended EXTREMITIES:  2+ pulse LLE, absent pulse RLE ASSESSMENT AND PLAN: .   Assessment and Plan    Coronary artery disease status post PCI (LAD) with unstable angina Status post PCI in April 2025 for unstable angina with no current symptoms. EKG and heart ultrasound show normal function. - Continue dual antiplatelet therapy with aspirin  and Plavix  until May 25, 2024. - Stop Plavix  on May 25, 2024 and continue aspirin  indefinitely. - Recheck lipids and LPA today. - LDL goal less than 55 mg/dL.  Hypertension Blood pressure elevated in office, likely white coat syndrome. Home readings 120-140 mmHg. Metoprolol  may contribute to erectile dysfunction. - Stop metoprolol  by taking half tablet twice a day for three days and then stop. - Monitor blood pressure daily.  Hyperlipidemia Managed with Crestor  40 mg daily. Lipid levels need re-evaluation for LDL goal. - Recheck lipids today. - Continue Crestor  40 mg daily.  Suspected peripheral vascular disease with lower extremity edema Suspected due to poor pulse in right lower extremity and impaired sensation. Edema likely exacerbated by high BMI and hypertension. - Set up for ABIs and vascular ultrasounds to assess for blockages. - Elevate legs and monitor salt intake.  Erectile dysfunction Characterized by ability to achieve but not maintain erection. Metoprolol  may contribute. Poor pulse in right lower extremity suggests possible vascular issue. Viagra safe if metoprolol  cessation does not resolve issue, provided nitroglycerin  is not used. - Stop metoprolol  by taking half tablet twice a day for three days and then stop. - Set up for ABIs and vascular ultrasounds to assess for PAD. - Consider Viagra if stopping metoprolol  does not resolve the issue.  Obesity BMI is 46. Weight loss encouraged for cardiovascular benefits. Discussion with primary care about GLP-1 agonists like Zepbound recommended. - Discuss GLP-1 agonists with primary care doctor for weight loss and cardiovascular benefits.              Follow-up: Return in about 4 months (around 07/10/2024).   Signed, Darryle DASEN. Barbaraann, MD, [facility]  [facility]  [address] [place], [place] [address] [phone]  9:28 AM

## 2024-03-10 ENCOUNTER — Encounter: Payer: Self-pay | Admitting: *Deleted

## 2024-03-10 ENCOUNTER — Ambulatory Visit: Attending: Cardiovascular Disease | Admitting: Cardiovascular Disease

## 2024-03-10 ENCOUNTER — Encounter: Payer: Self-pay | Admitting: Cardiovascular Disease

## 2024-03-10 VITALS — BP 171/98 | HR 57 | Ht 67.0 in | Wt 294.2 lb

## 2024-03-10 DIAGNOSIS — I150 Renovascular hypertension: Secondary | ICD-10-CM | POA: Diagnosis not present

## 2024-03-10 DIAGNOSIS — E782 Mixed hyperlipidemia: Secondary | ICD-10-CM

## 2024-03-10 DIAGNOSIS — I2510 Atherosclerotic heart disease of native coronary artery without angina pectoris: Secondary | ICD-10-CM

## 2024-03-10 DIAGNOSIS — I739 Peripheral vascular disease, unspecified: Secondary | ICD-10-CM

## 2024-03-10 DIAGNOSIS — N529 Male erectile dysfunction, unspecified: Secondary | ICD-10-CM

## 2024-03-10 NOTE — Patient Instructions (Addendum)
 Medication Instructions:  Your physician has recommended you make the following change in your medication:  REDUCE the Metoprolol  to 1/2 tablet twice a day for 3 days then stop it ON OCTOBER 30  YOU CAN STOP PLAVIX  BUT CONTINUE ASPIRIN .  *If you need a refill on your cardiac medications before your next appointment, please call your pharmacy*  Lab Work: TODAY:  LIPID & LPa  If you have labs (blood work) drawn today and your tests are completely normal, you will receive your results only by: MyChart Message (if you have MyChart) OR A paper copy in the mail If you have any lab test that is abnormal or we need to change your treatment, we will call you to review the results.  Testing/Procedures: Your physician has requested that you have a lower extremity arterial exercise duplex with ABI's.   During this test, exercise and ultrasound are used to evaluate arterial blood flow in the legs. Allow one hour for this exam. There are no restrictions or special instructions.   Follow-Up: At [facility], you and your health needs are our priority.  As part of our continuing mission to provide you with exceptional heart care, our providers are all part of one team.  This team includes your primary Cardiologist (physician) and Advanced Practice Providers or APPs (Physician Assistants and Nurse Practitioners) who all work together to provide you with the care you need, when you need it.  Your next appointment:   4 month(s)  Provider:   Darryle ONEIDA Decent, MD    We recommend signing up for the patient portal called MyChart.  Sign up information is provided on this After Visit Summary.  MyChart is used to connect with patients for Virtual Visits (Telemedicine).  Patients are able to view lab/test results, encounter notes, upcoming appointments, etc.  Non-urgent messages can be sent to your provider as well.   To learn more about what you can do with MyChart, go to [email or web address].    Other Instructions

## 2024-03-11 ENCOUNTER — Ambulatory Visit: Payer: Self-pay | Admitting: Cardiovascular Disease

## 2024-03-14 LAB — LIPID PANEL
Chol/HDL Ratio: 2.7 ratio (ref 0.0–5.0)
Cholesterol, Total: 111 mg/dL (ref 100–199)
HDL: 41 mg/dL (ref >39)
LDL Chol Calc (NIH): 51 mg/dL (ref 0–99)
Triglycerides: 100 mg/dL (ref 0–149)
VLDL Cholesterol Cal: 19 mg/dL (ref 5–40)

## 2024-03-14 LAB — LIPOPROTEIN A (LPA): Lipoprotein (a): 198.9 nmol/L — AB (ref <75.0)

## 2024-03-31 ENCOUNTER — Ambulatory Visit (HOSPITAL_COMMUNITY)
Admission: RE | Admit: 2024-03-31 | Discharge: 2024-03-31 | Disposition: A | Discharge status: Home / Self Care | Source: Ambulatory Visit | Attending: Cardiovascular Disease | Admitting: Cardiovascular Disease

## 2024-03-31 ENCOUNTER — Ambulatory Visit (HOSPITAL_BASED_OUTPATIENT_CLINIC_OR_DEPARTMENT_OTHER)
Admission: RE | Admit: 2024-03-31 | Discharge: 2024-03-31 | Disposition: A | Discharge status: Home / Self Care | Source: Ambulatory Visit | Attending: Cardiovascular Disease | Admitting: Cardiovascular Disease

## 2024-03-31 ENCOUNTER — Encounter (HOSPITAL_COMMUNITY): Payer: Self-pay

## 2024-03-31 DIAGNOSIS — I739 Peripheral vascular disease, unspecified: Secondary | ICD-10-CM | POA: Diagnosis present

## 2024-04-03 LAB — VAS US ABI WITH/WO TBI
Left ABI: 1.28
Right ABI: 1.3

## 2024-04-24 ENCOUNTER — Other Ambulatory Visit: Payer: Self-pay | Admitting: Cardiology

## 2024-05-01 ENCOUNTER — Encounter (INDEPENDENT_AMBULATORY_CARE_PROVIDER_SITE_OTHER): Payer: Self-pay | Admitting: *Deleted

## 2024-05-02 ENCOUNTER — Ambulatory Visit: Admitting: Nutrition

## 2024-06-19 ENCOUNTER — Other Ambulatory Visit: Payer: Self-pay | Admitting: Internal Medicine

## 2024-07-02 NOTE — Progress Notes (Signed)
 Cardiology Office Note:  .   Date:  07/12/2024  ID:  Robert Curtis, DOB Jan 26, 1961, MRN [id number] PCP: Marvine Rush, MD  [facility] Providers Cardiologist:  Darryle ONEIDA Decent, MD   History of Present Illness: .    Chief Complaint  Patient presents with   Follow-up    Robert Curtis is a 63 y.o. male with below history who presents for follow-up.   History of Present Illness   Robert Curtis is a 63 year old male with coronary artery disease who presents for follow-up.  He has a history of coronary artery disease and underwent percutaneous coronary intervention in April 2025. He is currently taking clopidogrel , rosuvastatin  40 mg, and baby aspirin . He has not used nitroglycerin  recently but refilled it after the previous supply turned to powder. He experiences occasional chest tightness but no significant chest pressure or dyspnea similar to previous episodes. Some symptoms are attributed to allergies.  His blood pressure at home is generally around 130/80 mmHg, but it was recorded as 136/92 mmHg during the visit, which he notes is higher than usual. He is on olmesartan 40 mg and hydrochlorothiazide 25 mg for hypertension.  He uses a CPAP machine for obstructive sleep apnea and reports compliance with its use.  He started Zepbound a month ago and has lost weight, going from 294 pounds to 280 pounds. He works at [employer] and is on his feet for about ten hours a day, but does not engage in additional exercise.  He has an elevated lipoprotein(a) level.   Still taking celebrex. We again discussed the risk of heart disease with this.           Problem List CAD -[place]  10/2023 -> PCI pLAD Obesity 2. HLD -T chol 111, HDL 41, LDL 51, TG 100 - Lpa 198 3. HTN 4. Fibromyalgia  5. OSA    ROS: All other ROS reviewed and negative. Pertinent positives noted in the HPI.     Studies Reviewed: SABRA       LHC 11/23/2023   Prox LAD-1 lesion is 90% stenosed.    Prox LAD-2 lesion is 90% stenosed.   1st Mrg lesion is 30% stenosed.   A stent was successfully placed.   Post intervention, there is a 0% residual stenosis.   Post intervention, there is a 0% residual stenosis.   1.  High-grade proximal LAD disease treated with Cutting Balloon angioplasty, 2 overlapping drug-eluting stents, and OCT optimization. 2.  LVEDP of 23 mmHg.   Physical Exam:   VS:  BP (!) 136/92 (BP Location: Left Arm, Patient Position: Sitting)   Pulse 67   Ht 5' 7 (1.702 m)   Wt 280 lb 12.8 oz (127.4 kg)   SpO2 96%   BMI 43.98 kg/m    Wt Readings from Last 3 Encounters:  07/12/24 280 lb 12.8 oz (127.4 kg)  03/10/24 294 lb 3.2 oz (133.4 kg)  02/15/24 295 lb (133.8 kg)    GEN: Well nourished, well developed in no acute distress NECK: No JVD; No carotid bruits CARDIAC: RRR, no murmurs, rubs, gallops RESPIRATORY:  Clear to auscultation without rales, wheezing or rhonchi  ABDOMEN: Soft, non-tender, non-distended EXTREMITIES:  No edema; No deformity  ASSESSMENT AND PLAN: .   Assessment and Plan    Coronary artery disease status post percutaneous coronary intervention with drug-eluting stent Completed six months of DAPT. Occasional chest tightness reported, no significant symptoms requiring nitroglycerin . - Discontinued clopidogrel . - Continue aspirin  81  mg daily indefinitely. - Instructed to find another medication other than celebrex for fibromyalgia. We discussed issues with NSAIDS and CAD.   Hypertension Blood pressure slightly elevated in-office, expected to improve with weight loss. - Continue olmesartan 40 mg and hydrochlorothiazide 25 mg daily. - Monitor blood pressure regularly at home.  Morbid obesity BMI 44. Weight loss of 15 pounds achieved with Zepbound. - Continue Zepbound for weight management.  Obstructive sleep apnea Compliant with CPAP therapy. - Continue CPAP therapy.  Elevated lipoprotein(a) Elevated lipoprotein(a) is a genetic marker for  heart disease. Discussed family evaluation, especially for daughter by age 84. - Recommended family members, especially daughter, be evaluated for elevated lipoprotein(a).  Hyperlipidemia Cholesterol levels increased but LDL at goal. On rosuvastatin  40 mg daily. - Continue rosuvastatin  40 mg daily. - Forward cholesterol lab results from primary care to cardiologist for monitoring.                Follow-up: Return in about 1 year (around 07/12/2025).  Signed, Darryle DASEN. Barbaraann, MD, [facility]  [facility]  [address] [place], [place] [address] [phone]  10:29 AM

## 2024-07-12 ENCOUNTER — Ambulatory Visit: Admitting: Cardiovascular Disease

## 2024-07-12 ENCOUNTER — Encounter: Payer: Self-pay | Admitting: Cardiovascular Disease

## 2024-07-12 VITALS — BP 136/92 | HR 67 | Ht 67.0 in | Wt 280.8 lb

## 2024-07-12 DIAGNOSIS — E782 Mixed hyperlipidemia: Secondary | ICD-10-CM | POA: Diagnosis not present

## 2024-07-12 DIAGNOSIS — I150 Renovascular hypertension: Secondary | ICD-10-CM

## 2024-07-12 DIAGNOSIS — I2510 Atherosclerotic heart disease of native coronary artery without angina pectoris: Secondary | ICD-10-CM

## 2024-07-12 NOTE — Patient Instructions (Signed)
 Medication Instructions:  Your physician has recommended you make the following change in your medication:   -Stop taking clopidogrel  (plavix ).  *If you need a refill on your cardiac medications before your next appointment, please call your pharmacy*   Follow-Up: At [facility], you and your health needs are our priority.  As part of our continuing mission to provide you with exceptional heart care, our providers are all part of one team.  This team includes your primary Cardiologist (physician) and Advanced Practice Providers or APPs (Physician Assistants and Nurse Practitioners) who all work together to provide you with the care you need, when you need it.  Your next appointment:   12 month(s)  Provider:   Darryle ONEIDA Decent, MD or One of our Advanced Practice Providers (APPs): Morse Clause, PA-C  Lamarr Satterfield, NP Miriam Shams, NP  Olivia Pavy, PA-C Josefa Beauvais, NP  Leontine Salen, PA-C Orren Fabry, PA-C  [place], PA-C Ernest Dick, NP  Damien Braver, NP Jon Hails, PA-C  Waddell Donath, PA-C    Dayna Dunn, PA-C  Scott Weaver, PA-C Lum Louis, NP Katlyn West, NP Callie Goodrich, PA-C  Xika Zhao, NP Sheng Haley, PA-C    Kathleen Johnson, PA-C
